# Patient Record
Sex: Female | Born: 1985 | Race: White | Hispanic: No | Marital: Married | Smoking: Former smoker
Health system: Southern US, Community
[De-identification: ages and names within clinical notes are randomized; demographics above are authoritative.]

## PROBLEM LIST (undated history)

## (undated) DIAGNOSIS — J45909 Unspecified asthma, uncomplicated: Secondary | ICD-10-CM

## (undated) DIAGNOSIS — K589 Irritable bowel syndrome without diarrhea: Secondary | ICD-10-CM

## (undated) DIAGNOSIS — D8940 Mast cell activation, unspecified: Secondary | ICD-10-CM

## (undated) DIAGNOSIS — E063 Autoimmune thyroiditis: Secondary | ICD-10-CM

## (undated) HISTORY — PX: OTHER SURGICAL HISTORY: SHX169

## (undated) HISTORY — PX: ADENOIDECTOMY: SUR15

## (undated) HISTORY — PX: TONSILLECTOMY: SUR1361

## (undated) HISTORY — PX: WISDOM TOOTH EXTRACTION: SHX21

## (undated) HISTORY — PX: ABDOMINAL HYSTERECTOMY: SHX81

---

## 2019-06-11 ENCOUNTER — Other Ambulatory Visit: Payer: Self-pay | Admitting: Obstetrics and Gynecology

## 2019-06-11 DIAGNOSIS — E041 Nontoxic single thyroid nodule: Secondary | ICD-10-CM

## 2019-06-17 ENCOUNTER — Encounter (INDEPENDENT_AMBULATORY_CARE_PROVIDER_SITE_OTHER): Payer: Self-pay

## 2019-06-17 ENCOUNTER — Ambulatory Visit
Admission: RE | Admit: 2019-06-17 | Discharge: 2019-06-17 | Disposition: A | Discharge status: Home / Self Care | Payer: Self-pay | Source: Ambulatory Visit | Attending: Obstetrics and Gynecology | Admitting: Obstetrics and Gynecology

## 2019-06-17 ENCOUNTER — Other Ambulatory Visit: Payer: Self-pay

## 2019-06-17 DIAGNOSIS — E041 Nontoxic single thyroid nodule: Secondary | ICD-10-CM | POA: Insufficient documentation

## 2019-12-11 DIAGNOSIS — N9489 Other specified conditions associated with female genital organs and menstrual cycle: Secondary | ICD-10-CM | POA: Insufficient documentation

## 2019-12-11 DIAGNOSIS — E669 Obesity, unspecified: Secondary | ICD-10-CM | POA: Insufficient documentation

## 2019-12-11 DIAGNOSIS — K580 Irritable bowel syndrome with diarrhea: Secondary | ICD-10-CM | POA: Insufficient documentation

## 2019-12-11 DIAGNOSIS — N946 Dysmenorrhea, unspecified: Secondary | ICD-10-CM | POA: Insufficient documentation

## 2020-04-17 ENCOUNTER — Encounter: Payer: Self-pay | Admitting: Emergency Medicine

## 2020-04-17 ENCOUNTER — Ambulatory Visit
Admission: EM | Admit: 2020-04-17 | Discharge: 2020-04-17 | Disposition: A | Discharge status: Home / Self Care | Payer: Managed Care, Other (non HMO)

## 2020-04-17 ENCOUNTER — Other Ambulatory Visit: Payer: Self-pay

## 2020-04-17 DIAGNOSIS — L0390 Cellulitis, unspecified: Secondary | ICD-10-CM | POA: Diagnosis not present

## 2020-04-17 DIAGNOSIS — W57XXXA Bitten or stung by nonvenomous insect and other nonvenomous arthropods, initial encounter: Secondary | ICD-10-CM | POA: Diagnosis not present

## 2020-04-17 DIAGNOSIS — S40262A Insect bite (nonvenomous) of left shoulder, initial encounter: Secondary | ICD-10-CM | POA: Diagnosis not present

## 2020-04-17 HISTORY — DX: Mast cell activation, unspecified: D89.40

## 2020-04-17 HISTORY — DX: Unspecified asthma, uncomplicated: J45.909

## 2020-04-17 HISTORY — DX: Autoimmune thyroiditis: E06.3

## 2020-04-17 MED ORDER — DOXYCYCLINE HYCLATE 100 MG PO CAPS: 100.0000 mg | ORAL_CAPSULE | Freq: Two times a day (BID) | ORAL | 0 refills | Status: DC

## 2020-04-17 NOTE — ED Provider Notes (Signed)
[facility]    CSN: [id number] Arrival date & time: 04/17/20  1248      History   Chief Complaint Chief Complaint  Patient presents with  . Insect Bite    HPI Jeanne Bates is a 34 y.o. female who presents with insect bite on her L posterior shoulder 3 days ago, and yesterday noticed her L axilla nodes are getting swollen. She states when she palpated the area, it felt raised like with a mosquito bite. She squeezed it to make sure if it was a tick, she could get all of it out. She has been applying antibiotic ointment on it. She denies fever. myalgias or chills.     Past Medical History:  Diagnosis Date  . Asthma   . Hashimoto thyroiditis, fibrous variant   . Mast cell activation syndrome (HCC)     There are no problems to display for this patient.   Past Surgical History:  Procedure Laterality Date  . ABDOMINAL HYSTERECTOMY    . ADENOIDECTOMY    . opharectomy    . TONSILLECTOMY    . WISDOM TOOTH EXTRACTION      OB History   No obstetric history on file.      Home Medications    Prior to Admission medications   Medication Sig Start Date End Date Taking? Authorizing Provider  b complex vitamins capsule Take 1 capsule by mouth daily.   Yes [provider]  cetirizine (ZYRTEC) 10 MG tablet Take by mouth.   Yes [provider]  Cholecalciferol 50 MCG (2000 UT) CAPS Take by mouth.   Yes [provider]  diphenhydrAMINE HCl (BENADRYL ALLERGY PO) Take by mouth.   Yes [provider]  EPINEPHrine 0.3 mg/0.3 mL IJ SOAJ injection Inject into the muscle. 07/08/19 07/07/20 Yes [provider]  famotidine (PEPCID) 20 MG tablet Take 20 mg by mouth 2 (two) times daily. 03/31/20  Yes [provider]  fluticasone (FLONASE) 50 MCG/ACT nasal spray 1 Spray by each nostril route Once a Day.   Yes [provider]  Probiotic Product (PROBIOTIC DAILY PO) Take by mouth.   Yes [provider]  VITAMIN E  PO Take by mouth.   Yes [provider]  zinc gluconate 50 MG tablet Take by mouth.   Yes [provider]  doxycycline (VIBRAMYCIN) 100 MG capsule Take 1 capsule (100 mg total) by mouth 2 (two) times daily. 04/17/20   Rodriguez-Southworth, Nettie Elm, PA-C    Family History Family History  Problem Relation Age of Onset  . Diabetes Mother   . Asthma Mother   . Hyperlipidemia Mother   . Hyperlipidemia Father   . Heart disease Father     Social History Social History   Tobacco Use  . Smoking status: Former Smoker    Quit date: 04/18/2007    Years since quitting: 13.0  . Smokeless tobacco: Never Used  Vaping Use  . Vaping Use: Never used  Substance Use Topics  . Alcohol use: Yes    Comment: occassional  . Drug use: Never     Allergies   Black walnut pollen allergy skin test, Cephalexin, Liothyronine, Nyquil hbp cold & flu [dm-doxylamine-acetaminophen], Prunus persica, Oxycodone-acetaminophen, Sulfamethoxazole-trimethoprim, Tramadol, Peanut oil, and Strawberry extract   Review of Systems Review of Systems  Constitutional: Negative for activity change, chills and fever.  Skin: Positive for rash and wound.  Hematological: Positive for adenopathy.     Physical Exam Triage Vital Signs ED Triage Vitals  Enc Vitals Group     BP 04/17/20 1335 (!) 118/58     Pulse Rate 04/17/20 1335 94     Resp 04/17/20 1335 18     Temp 04/17/20 1335 98.5 F (36.9 C)     Temp Source 04/17/20 1335 Oral     SpO2 04/17/20 1335 99 %     Weight 04/17/20 1335 210 lb (95.3 kg)     Height 04/17/20 1335 5\' 2"  (1.575 m)     Head Circumference --      Peak Flow --      Pain Score 04/17/20 1334 2     Pain Loc --      Pain Edu? --      Excl. in GC? --    No data found.  Updated Vital Signs BP (!) 118/58 (BP Location: Left Arm)   Pulse 94   Temp 98.5 F (36.9 C) (Oral)   Resp 18   Ht 5\' 2"  (1.575 m)   Wt 210 lb (95.3 kg)   SpO2 99%   BMI 38.41 kg/m   Visual Acuity Right  Eye Distance:   Left Eye Distance:   Bilateral Distance:    Right Eye Near:   Left Eye Near:    Bilateral Near:     Physical Exam Vitals and nursing note reviewed.  Constitutional:      General: She is not in acute distress.    Appearance: She is obese. She is not toxic-appearing.  HENT:     Head: Normocephalic.     Right Ear: External ear normal.     Left Ear: External ear normal.  Eyes:     General: No scleral icterus.    Conjunctiva/sclera: Conjunctivae normal.  Pulmonary:     Effort: Pulmonary effort is normal.  Musculoskeletal:        General: Normal range of motion.     Cervical back: Neck supple.  Lymphadenopathy:     Upper Body:     Right upper body: No axillary adenopathy.     Left upper body: Axillary adenopathy present.  Skin:    General: Skin is warm and dry.          Comments: Has a dollar size pink erythematous circular around the bite site which is about 2 mm x 2 mm wound that is having a scab forming.   Neurological:     Mental Status: She is alert and oriented to person, place, and time.     Gait: Gait normal.  Psychiatric:        Mood and Affect: Mood normal.        Behavior: Behavior normal.        Thought Content: Thought content normal.        Judgment: Judgment normal.      UC Treatments / Results  Labs (all labs ordered are listed, but only abnormal results are displayed) Labs Reviewed - No data to display  EKG   Radiology No results found.  Procedures Procedures (including critical care time)  Medications Ordered in UC Medications - No data to display  Initial Impression / Assessment and Plan / UC Course  I have reviewed the triage vital signs and the nursing notes. Has insect bite with cellulitis. I placed her on Doxy as noted. FU if she gets worse.  Final Clinical Impressions(s) / UC Diagnoses   Final diagnoses:  Insect bite of left shoulder, initial encounter  Cellulitis, unspecified cellulitis site   Discharge  Instructions  None    ED Prescriptions    Medication Sig Dispense Auth. Provider   doxycycline (VIBRAMYCIN) 100 MG capsule Take 1 capsule (100 mg total) by mouth 2 (two) times daily. 20 capsule Rodriguez-Southworth, Nettie Elm, PA-C     PDMP not reviewed this encounter.   Garey Ham, PA-C 04/17/20 1434

## 2020-04-17 NOTE — ED Triage Notes (Signed)
Patient in today c/o insect bite on her left shoulder/arm x 3 days. Patient states her lymph nodes are swollen under her left arm. Patient has been using OTC antibiotic cream.

## 2020-12-28 IMAGING — US US THYROID
1 series · 14 of 25 positions shown · non-contrast
Comparison: None.

CLINICAL DATA: Other. 33-year-old female with a reported left-sided
thyroid nodule.

EXAM:
THYROID ULTRASOUND
TECHNIQUE: Ultrasound examination of the thyroid gland and adjacent soft
tissues was performed.

[Series 1: us thyroid · 0.07mm/px · 14 of 57 slices shown]
[im 1/57]
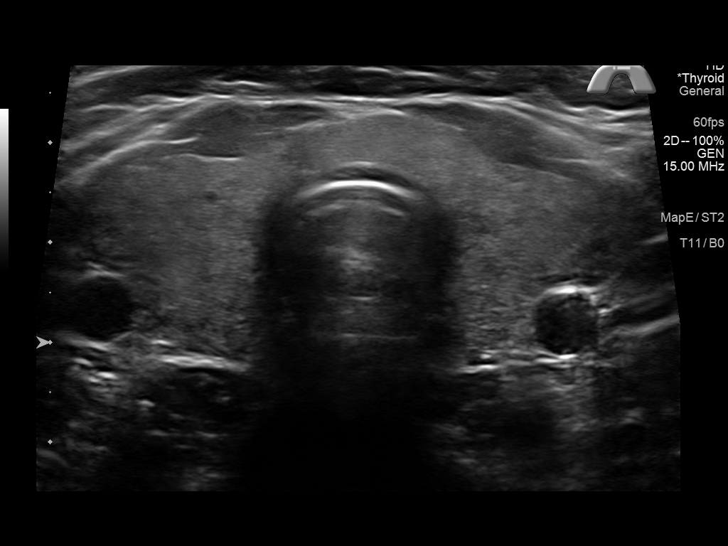
[im 5/57]
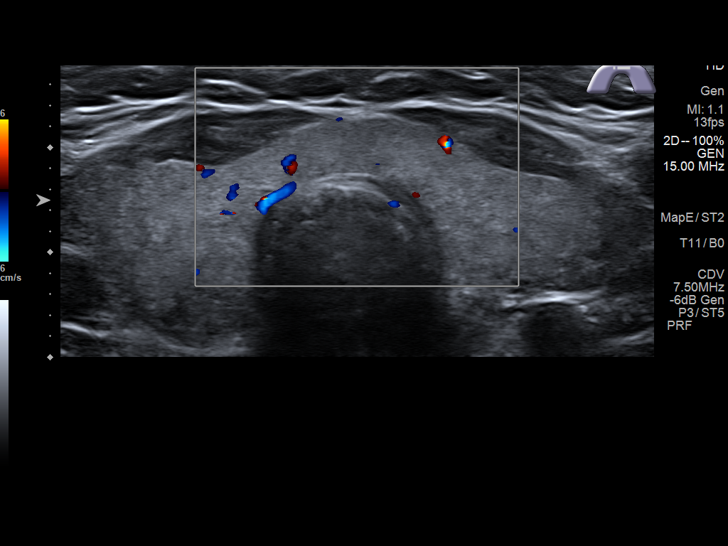
[im 10/57]
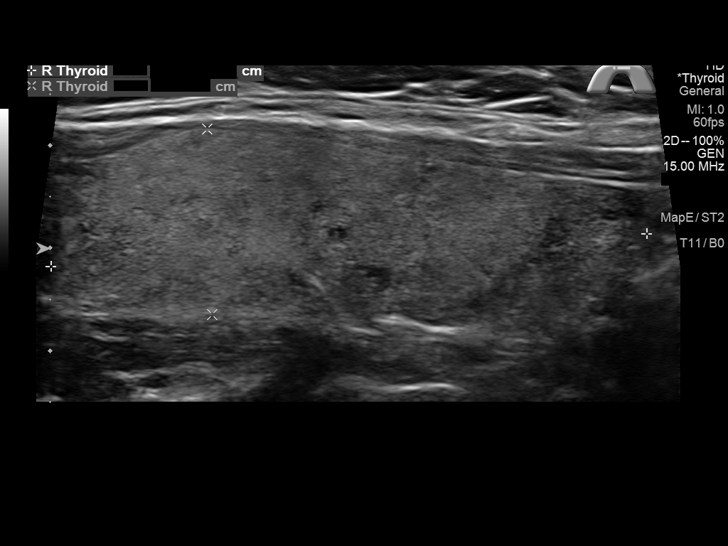
[im 15/57]
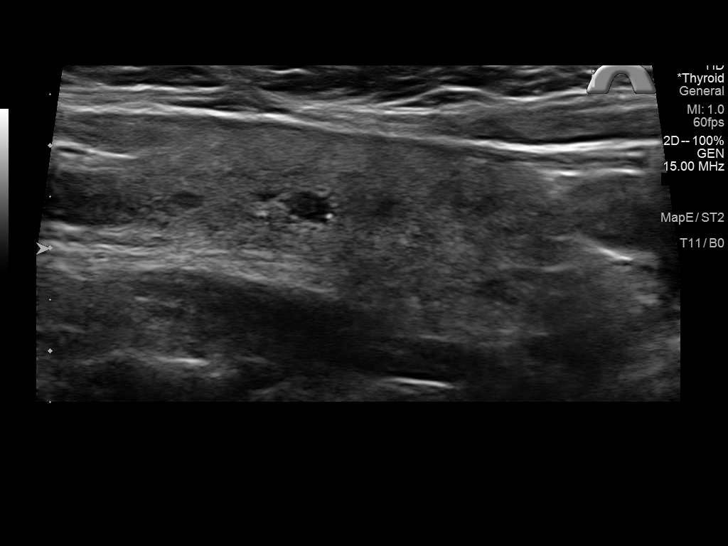
[im 19/57]
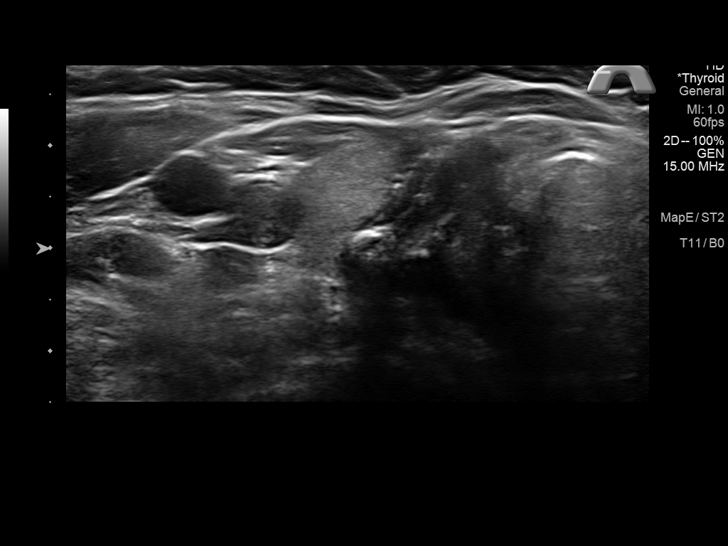
[im 22/57]
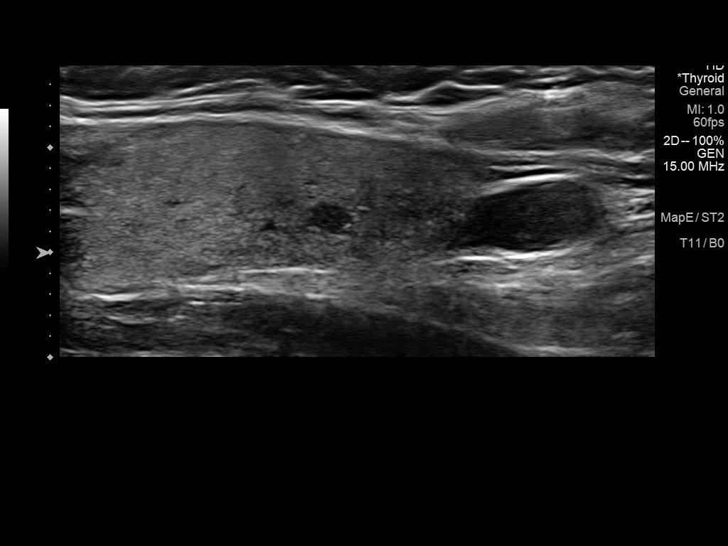
[im 26/57]
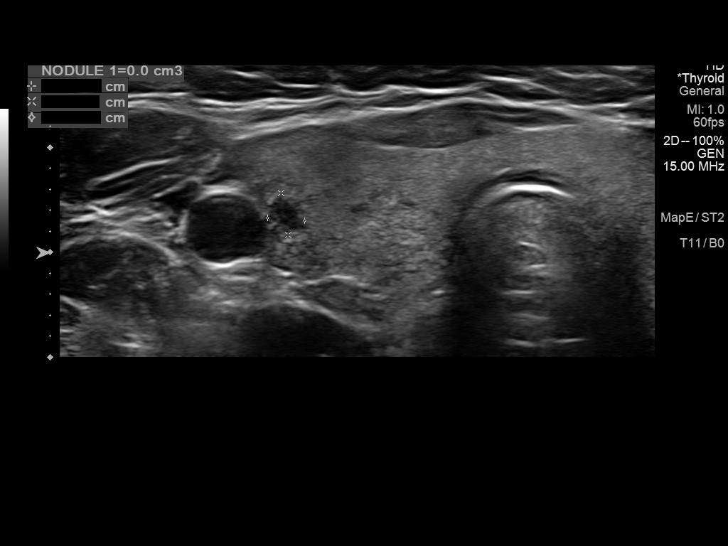
[im 31/57]
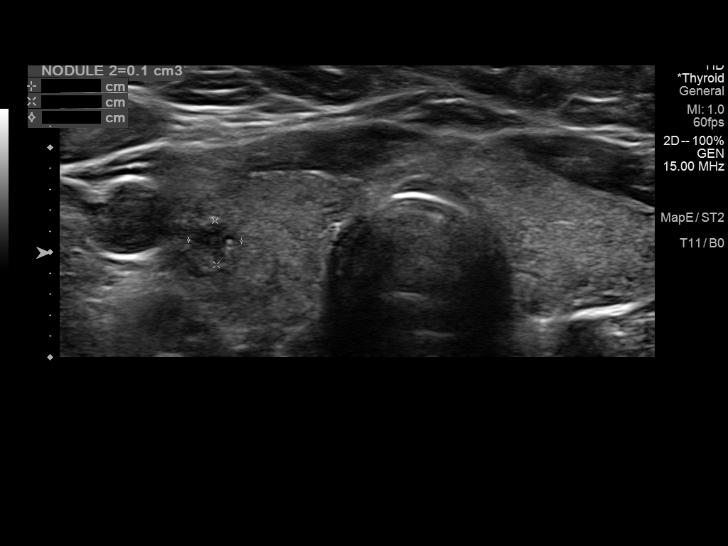
[im 36/57]
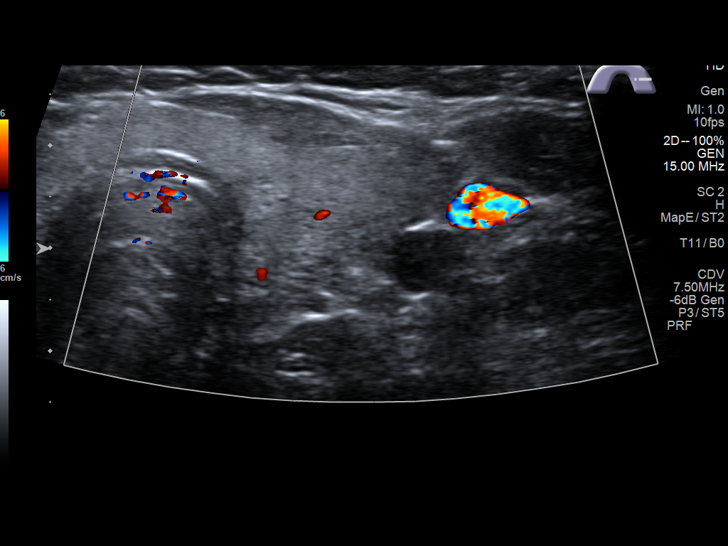
[im 38/57]
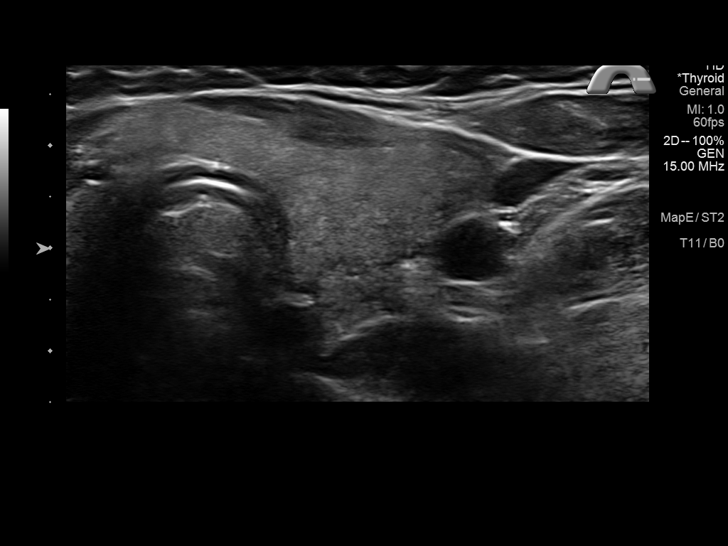
[im 43/57]
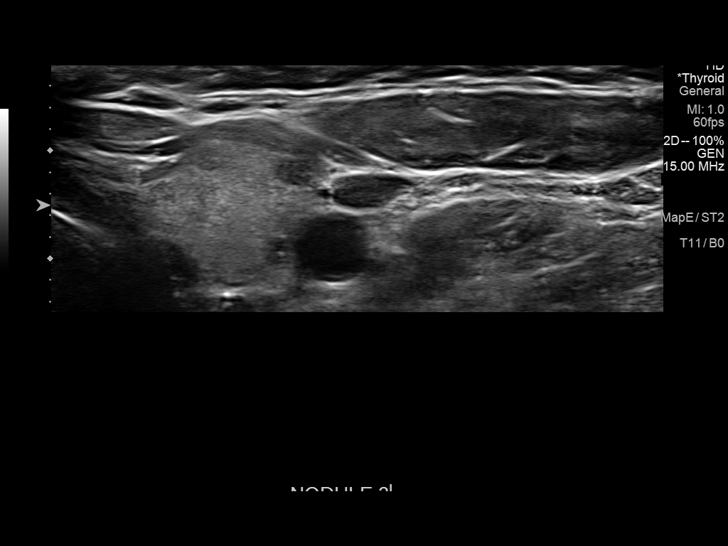
[im 47/57]
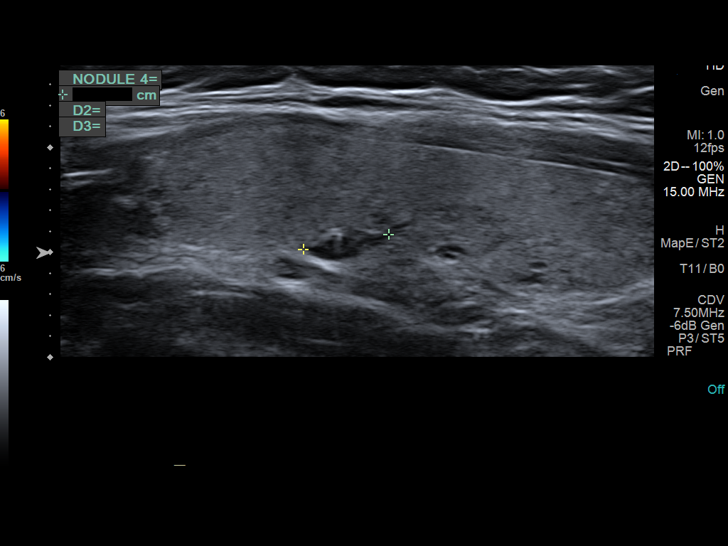
[im 52/57]
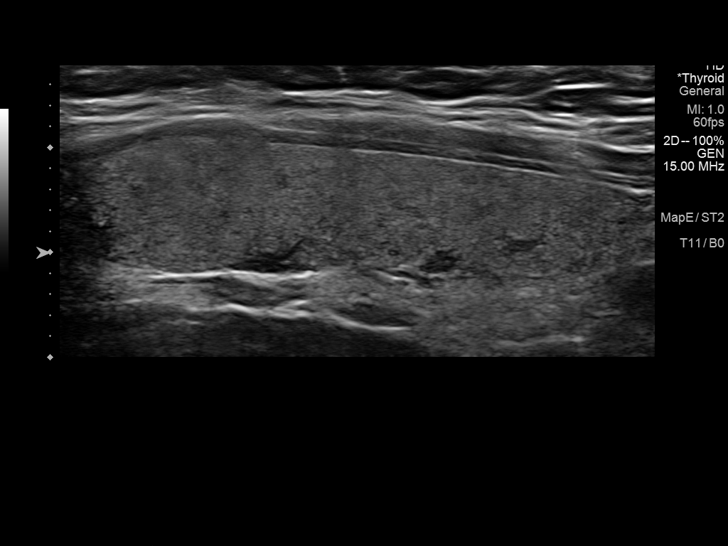
[im 57/57]
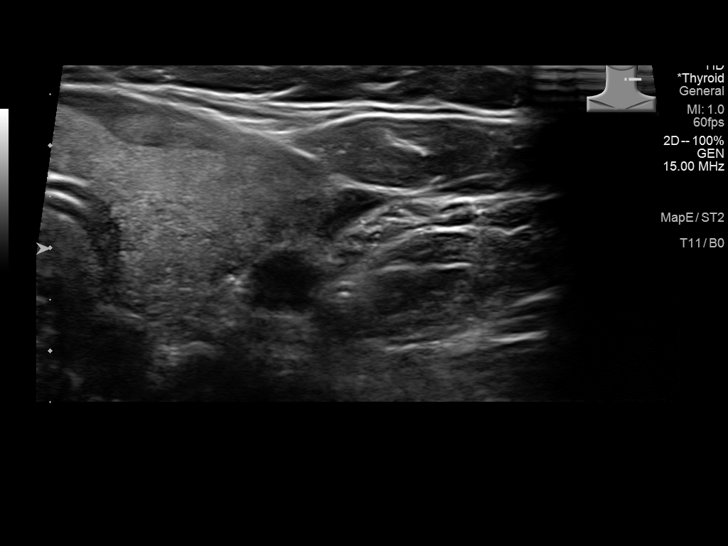

[14 of 25 positions shown; findings below may reference images not displayed]

FINDINGS: Parenchymal Echotexture: Moderately heterogenous

Isthmus: 0.6 cm

Right lobe: 5.8 x 1.8 x 2.0 cm

Left lobe: 5.4 x 1.6 x 2.0 cm

_________________________________________________________

Estimated total number of nodules >/= 1 cm: 0

Number of spongiform nodules >/=  2 cm not described below (TR1): 0

Number of mixed cystic and solid nodules >/= 1.5 cm not described
below (TR2): 0

_________________________________________________________

Mildly heterogeneous and enlarged thyroid gland containing multiple
small bilateral thyroid nodules. None of the nodules meet imaging
criteria to recommend biopsy or dedicated imaging surveillance.
IMPRESSION: Heterogeneous and diffusely large thyroid gland containing multiple
small thyroid nodules. None of the nodules meet imaging criteria to
recommend either biopsy or dedicated imaging surveillance.

The above is in keeping with the ACR TI-RADS recommendations - [HOSPITAL] 8098;[DATE].

## 2021-03-22 ENCOUNTER — Other Ambulatory Visit: Payer: Self-pay

## 2021-03-22 ENCOUNTER — Ambulatory Visit
Admission: RE | Admit: 2021-03-22 | Discharge: 2021-03-22 | Disposition: A | Discharge status: Home / Self Care | Payer: BC Managed Care – PPO | Source: Ambulatory Visit | Attending: Emergency Medicine | Admitting: Emergency Medicine

## 2021-03-22 VITALS — BP 141/101 | HR 124 | Temp 99.2°F | Resp 18

## 2021-03-22 DIAGNOSIS — U071 COVID-19: Secondary | ICD-10-CM | POA: Diagnosis not present

## 2021-03-22 DIAGNOSIS — J45909 Unspecified asthma, uncomplicated: Secondary | ICD-10-CM | POA: Diagnosis present

## 2021-03-22 DIAGNOSIS — Z760 Encounter for issue of repeat prescription: Secondary | ICD-10-CM | POA: Diagnosis present

## 2021-03-22 HISTORY — DX: Irritable bowel syndrome, unspecified: K58.9

## 2021-03-22 LAB — RESP PANEL BY RT-PCR (FLU A&B, COVID) ARPGX2
Influenza A by PCR: NEGATIVE
Influenza B by PCR: NEGATIVE
SARS Coronavirus 2 by RT PCR: POSITIVE — AB

## 2021-03-22 LAB — GROUP A STREP BY PCR: Group A Strep by PCR: NOT DETECTED

## 2021-03-22 MED ORDER — MOLNUPIRAVIR EUA 200MG CAPSULE: 4.0000 | ORAL_CAPSULE | Freq: Two times a day (BID) | ORAL | 0 refills | Status: AC

## 2021-03-22 MED ORDER — IBUPROFEN 600 MG PO TABS: 600.0000 mg | ORAL_TABLET | Freq: Four times a day (QID) | ORAL | 0 refills | Status: DC | PRN

## 2021-03-22 MED ORDER — FLUTICASONE PROPIONATE 50 MCG/ACT NA SUSP: 2.0000 | Freq: Every day | NASAL | 0 refills | Status: AC

## 2021-03-22 MED ORDER — ALUM & MAG HYDROXIDE-SIMETH 200-200-20 MG/5ML PO SUSP
30.0000 mL | Freq: Once | ORAL | Status: AC
  Administered 2021-03-22: 30 mL via ORAL

## 2021-03-22 MED ORDER — PROAIR RESPICLICK 108 (90 BASE) MCG/ACT IN AEPB: 2.0000 | INHALATION_SPRAY | Freq: Four times a day (QID) | RESPIRATORY_TRACT | 0 refills | Status: DC | PRN

## 2021-03-22 MED ORDER — AEROCHAMBER PLUS MISC: 2 refills | Status: AC

## 2021-03-22 MED ORDER — LIDOCAINE VISCOUS HCL 2 % MT SOLN
15.0000 mL | Freq: Once | OROMUCOSAL | Status: AC
  Administered 2021-03-22: 15 mL via ORAL

## 2021-03-22 NOTE — ED Notes (Signed)
Declines covid test at this time. She states a negative at home test yesterday.

## 2021-03-22 NOTE — Discharge Instructions (Addendum)
Start Flonase, Mucinex D.  Discontinue your antihistamines while taking the Mucinex D if you can.  You may take 600 mg of motrin with 1000 mg of tylenol up to 3-4 times a day as needed for pain. This is an effective combination for pain.  Use a NeilMed sinus rinse with distilled water as often as you want to to reduce nasal congestion. Follow the directions on the box.   Make sure you drink plenty of extra fluids.  Some people find salt water gargles and  Traditional Medicinal's "Throat Coat" tea helpful. Take 5 mL of liquid Benadryl and 5 mL of Maalox. Mix it together, and then hold it in your mouth for as long as you can and then swallow. You may do this 4 times a day.    Here is a list of primary care providers who are taking new patients:  Dr. Elizabeth Sauer [address] [place] [address] [phone]  [facility] [address] [place], [place] [address] [phone]  [facility] [place] [address] [place] [address]  [phone]  [facility] [address] [place], [place] [address] [phone]  [facility] [address] [place]  [phone] [place], [place] [address]  Here are clinics/ other resources who will see you if you do not have insurance. Some have certain criteria that you must meet. Call them and find out what they are:  [facility]: [address] [place], [place] [address] Phone: [phone] Hours: First and Third Saturdays of each Month, 9 a.m. - 1 p.m.  [facility]: [address] [place], [place] [address] Phone: [phone] Hours: Tuesday, 4 p.m. - 8 p.m. Thursday, 1 p.m. - 8 p.m. Wednesday, 9 a.m. - [facility] [address], [place], [place] [address] Phone: [phone] Pharmacy Phone Number: [phone] Dental Phone Number: [phone] [facility] Insurance Help: [phone]  Dental Hours: Monday - Thursday, 8 a.m. - 6 p.m.  Phineas Real  [facility] [address] [place], [place] [address] Phone: [phone] Pharmacy Phone Number: [phone] [facility] Insurance Help: [phone]  [facility] [address] [place], [place] [address] Phone: [phone] Pharmacy Phone Number: [phone] [facility] Insurance Help: [phone]  [facility] [address] [place], [place] [address] Phone: [phone] [facility] Insurance Help: [phone]   [facility] [address], [place], [place] [address] Phone: [phone]  Go to www.goodrx.com  or www.costplusdrugs.com to look up your medications. This will give you a list of where you can find your prescriptions at the most affordable prices. Or ask the pharmacist what the cash price is, or if they have any other discount programs available to help make your medication more affordable. This can be less expensive than what you would pay with insurance.

## 2021-03-22 NOTE — ED Provider Notes (Signed)
HPI  SUBJECTIVE:  Jeanne Bates is a 35 y.o. female who presents with 3 days of sore throat, bilateral ear pain worse on the left, fever T-max 102.1, body aches, headaches, nasal congestion, green/clear rhinorrhea, loss of sense of smell and taste, sinus pain and pressure, postnasal drip, cough.  States that she feels lightheaded.  States that she has not been able to eat due to sore throat.  She reports occasional laryngitis.  1 episode of diarrhea today, but is attributing this to her IBS no change in hearing, otorrhea, upper dental pain, facial swelling, wheezing, shortness of breath, dyspnea on exertion.  She states her chest feels "tight".  She denies chest pressure, heaviness, pleuritic chest pain.  No nausea, vomiting, abdominal pain, rash, drooling, trismus, neck stiffness, sensation of throat swelling shut, difficulty breathing.  She reports laryngitis/raspy voice.  No muffled voice.  She has been taking ibuprofen and Mucinex.  Last dose of ibuprofen was within 6 hours of evaluation.  No alleviating factors, symptoms are worse when she is febrile.  No calf pain, swelling, hemoptysis, recent immobilization, trauma, surgery in the past 4 weeks.  No COVID or flu exposure.  She did not get COVID or flu vaccines.  No tick bite in the past month.  She has a past medical history of COVID x2 , IBS, tonsillectomy, Hashimoto's thyroiditis, mast cell activation syndrome, asthma.  She states she needs a refill on her albuterol.  She does not have a spacer.  She does not take any other medications for her asthma.  LMP: Status post hysterectomy.  PMD: None.    Past Medical History:  Diagnosis Date   Asthma    Hashimoto thyroiditis, fibrous variant    IBS (irritable bowel syndrome)    Mast cell activation syndrome (HCC)     Past Surgical History:  Procedure Laterality Date   ABDOMINAL HYSTERECTOMY     ADENOIDECTOMY     opharectomy     TONSILLECTOMY     WISDOM TOOTH EXTRACTION      Family History   Problem Relation Age of Onset   Diabetes Mother    Asthma Mother    Hyperlipidemia Mother    Hyperlipidemia Father    Heart disease Father     Social History   Tobacco Use   Smoking status: Former    Types: Cigarettes    Quit date: 04/18/2007    Years since quitting: 13.9   Smokeless tobacco: Never  Vaping Use   Vaping Use: Never used  Substance Use Topics   Alcohol use: Yes    Comment: occassional   Drug use: Never    No current facility-administered medications for this encounter.  Current Outpatient Medications:    Albuterol Sulfate (PROAIR RESPICLICK) 108 (90 Base) MCG/ACT AEPB, Inhale 2 puffs into the lungs every 6 (six) hours as needed., Disp: 1 each, Rfl: 0   fluticasone (FLONASE) 50 MCG/ACT nasal spray, Place 2 sprays into both nostrils daily., Disp: 16 g, Rfl: 0   ibuprofen (ADVIL) 600 MG tablet, Take 1 tablet (600 mg total) by mouth every 6 (six) hours as needed., Disp: 30 tablet, Rfl: 0   molnupiravir EUA 200 mg CAPS, Take 4 capsules (800 mg total) by mouth 2 (two) times daily for 5 days., Disp: 40 capsule, Rfl: 0   Spacer/Aero-Holding Chambers (AEROCHAMBER PLUS) inhaler, Use with inhaler, Disp: 1 each, Rfl: 2   b complex vitamins capsule, Take 1 capsule by mouth daily., Disp: , Rfl:    Calcium Carbonate  Antacid (TUMS PO), Take by mouth., Disp: , Rfl:    cetirizine (ZYRTEC) 10 MG tablet, Take by mouth., Disp: , Rfl:    Cholecalciferol 50 MCG (2000 UT) CAPS, Take by mouth., Disp: , Rfl:    diphenhydrAMINE HCl (BENADRYL ALLERGY PO), Take by mouth., Disp: , Rfl:    famotidine (PEPCID) 20 MG tablet, Take 20 mg by mouth 2 (two) times daily., Disp: , Rfl:    Probiotic Product (PROBIOTIC DAILY PO), Take by mouth., Disp: , Rfl:    VITAMIN E PO, Take by mouth., Disp: , Rfl:    zinc gluconate 50 MG tablet, Take by mouth., Disp: , Rfl:   Allergies  Allergen Reactions   Black Walnut Pollen Allergy Skin Test Anaphylaxis   Cephalexin Hives   Citrus Anaphylaxis    Pt  reports throat swelling; histamine response per patient    Liothyronine Hives   Nyquil Hbp Cold & Flu [Dm-Doxylamine-Acetaminophen] Hives    Pt reports this is from the alcohol in nyquil   Prunus Persica Anaphylaxis   Strawberry (Diagnostic) Anaphylaxis     Throat swelling and "histamine response" per patient    Oxycodone-Acetaminophen Itching   Sulfamethoxazole-Trimethoprim    Tramadol Hives   Peanut Oil Nausea And Vomiting   Strawberry Extract Nausea And Vomiting     ROS  As noted in HPI.   Physical Exam  BP (!) 141/101 (BP Location: Left Arm)   Pulse (S) (!) 124   Temp 99.2 F (37.3 C) (Oral)   Resp 18   SpO2 98%   Constitutional: Well developed, well nourished, no acute distress.  Appears ill. Eyes:  EOMI, conjunctiva normal bilaterally HENT: Normocephalic, atraumatic,mucus membranes moist.  Bilateral external ears, EACs, TMs normal.  No pain with traction on pinna, palpation of tragus, palpation of mastoid bilaterally.  Extensive nasal congestion with erythematous, swollen turbinates.  Positive maxillary, frontal sinus tenderness.  Tonsils surgically absent.  Oropharynx erythematous.  Positive postnasal drip. Neck: Positive anterior cervical lymphadenopathy Respiratory: Normal inspiratory effort, lungs clear bilaterally. Cardiovascular: Regular tachycardia, no murmurs, rubs, gallops.  No chest wall tenderness. GI: nondistended skin: No rash, skin intact Musculoskeletal: Calves symmetric, nontender, no edema Neurologic: Alert & oriented x 3, no focal neuro deficits Psychiatric: Speech and behavior appropriate   ED Course   Medications  alum & mag hydroxide-simeth (MAALOX/MYLANTA) 200-200-20 MG/5ML suspension 30 mL (30 mLs Oral Given 03/22/21 1659)    And  lidocaine (XYLOCAINE) 2 % viscous mouth solution 15 mL (15 mLs Oral Given 03/22/21 1658)    Orders Placed This Encounter  Procedures   Group A Strep by PCR    Standing Status:   Standing    Number of  Occurrences:   1   Resp Panel by RT-PCR (Flu A&B, Covid) Nasopharyngeal Swab    Standing Status:   Standing    Number of Occurrences:   1    Order Specific Question:   Is this test for diagnosis or screening    Answer:   Diagnosis of ill patient    Order Specific Question:   Symptomatic for COVID-19 as defined by CDC    Answer:   Yes    Order Specific Question:   Date of Symptom Onset    Answer:   03/20/2021    Order Specific Question:   Hospitalized for COVID-19    Answer:   No    Order Specific Question:   Admitted to ICU for COVID-19    Answer:   No  Order Specific Question:   Previously tested for COVID-19    Answer:   No    Order Specific Question:   Resident in a congregate (group) care setting    Answer:   No    Order Specific Question:   Employed in healthcare setting    Answer:   No    Order Specific Question:   Pregnant    Answer:   No    Order Specific Question:   Has patient completed COVID vaccination(s) (2 doses of Pfizer/Moderna 1 dose of [employer])    Answer:   Unknown   Airborne and Contact precautions    Standing Status:   Standing    Number of Occurrences:   1   ED EKG    Standing Status:   Standing    Number of Occurrences:   1    Order Specific Question:   Reason for Exam    Answer:   Other (See Comments)    Results for orders placed or performed during the hospital encounter of 03/22/21 (from the past 24 hour(s))  Group A Strep by PCR     Status: None   Collection Time: 03/22/21  4:33 PM   Specimen: Throat; Sterile Swab  Result Value Ref Range   Group A Strep by PCR NOT DETECTED NOT DETECTED  Resp Panel by RT-PCR (Flu A&B, Covid) Nasopharyngeal Swab     Status: Abnormal   Collection Time: 03/22/21  4:34 PM   Specimen: Nasopharyngeal Swab; Nasopharyngeal(NP) swabs in vial transport medium  Result Value Ref Range   SARS Coronavirus 2 by RT PCR POSITIVE (A) NEGATIVE   Influenza A by PCR NEGATIVE NEGATIVE   Influenza B by PCR NEGATIVE NEGATIVE    No results found.  ED Clinical Impression  1. COVID-19 virus infection   2. Asthma, unspecified asthma severity, unspecified whether complicated, unspecified whether persistent   3. Medication refill      ED Assessment/Plan  EKG obtained due to tachycardia.  EKG: Sinus tachycardia, rate 122, normal axis, normal intervals.  No hypertrophy.  No ST-T wave changes.  Patient does not meet PERC criteria due to heart rate, however, Wells score for PE 1.5 points.  Her EKG shows tachycardia but has no other changes.  Feel that she is low risk for a PE.  Could be because she is ill versus thyroid disease flare versus  dehydration due to decreased p.o. intake.  Sore throat improved after GI cocktail.  Patient with an acute illness with systemic symptoms.  COVID-positive.  Suspect tachycardia is from this.  Home with Flonase, saline nasal irrigation, Benadryl/Maalox mixture, Mucinex D, Molnupiravir.   Labs are not available at this time.  Patient to monitor oxygen saturation.  She will go to the ED if it gets below 90% consistently.  We will also refill her albuterol inhaler and provide her a spacer as well.  Discussed labs, MDM, treatment plan, and plan for follow-up with patient. Discussed sn/sx that should prompt return to the ED. patient agrees with plan.   Meds ordered this encounter  Medications   AND Linked Order Group    alum & mag hydroxide-simeth (MAALOX/MYLANTA) 200-200-20 MG/5ML suspension 30 mL    lidocaine (XYLOCAINE) 2 % viscous mouth solution 15 mL   fluticasone (FLONASE) 50 MCG/ACT nasal spray    Sig: Place 2 sprays into both nostrils daily.    Dispense:  16 g    Refill:  0   Spacer/Aero-Holding Chambers (AEROCHAMBER PLUS) inhaler  Sig: Use with inhaler    Dispense:  1 each    Refill:  2    Please educate patient on use   ibuprofen (ADVIL) 600 MG tablet    Sig: Take 1 tablet (600 mg total) by mouth every 6 (six) hours as needed.    Dispense:  30 tablet    Refill:  0    Albuterol Sulfate (PROAIR RESPICLICK) 108 (90 Base) MCG/ACT AEPB    Sig: Inhale 2 puffs into the lungs every 6 (six) hours as needed.    Dispense:  1 each    Refill:  0   molnupiravir EUA 200 mg CAPS    Sig: Take 4 capsules (800 mg total) by mouth 2 (two) times daily for 5 days.    Dispense:  40 capsule    Refill:  0      *This clinic note was created using Scientist, clinical (histocompatibility and immunogenetics). Therefore, there may be occasional mistakes despite careful proofreading.  ?    Domenick Gong, MD 03/22/21 1734

## 2021-03-22 NOTE — ED Triage Notes (Signed)
Patient presents to Urgent Care with complaints of sore throat, SOB, cough, fever, and left ear pain since Saturday. Treating symptoms with advil and mucinex. Pt states she needs an albuterol medication refill.   Denies n/v or diarrhea.

## 2021-04-19 ENCOUNTER — Other Ambulatory Visit: Payer: Self-pay

## 2021-04-19 ENCOUNTER — Ambulatory Visit
Admission: RE | Admit: 2021-04-19 | Discharge: 2021-04-19 | Disposition: A | Discharge status: Home / Self Care | Payer: BC Managed Care – PPO | Source: Ambulatory Visit | Attending: Family Medicine | Admitting: Family Medicine

## 2021-04-19 VITALS — BP 108/79 | HR 75 | Temp 98.5°F | Resp 18 | Ht 62.0 in | Wt 210.1 lb

## 2021-04-19 DIAGNOSIS — H9202 Otalgia, left ear: Secondary | ICD-10-CM

## 2021-04-19 MED ORDER — KETOROLAC TROMETHAMINE 10 MG PO TABS: 10.0000 mg | ORAL_TABLET | Freq: Four times a day (QID) | ORAL | 0 refills | Status: DC | PRN

## 2021-04-19 NOTE — ED Triage Notes (Signed)
Pt c/o left ear pain. Started about a month ago, when she had covid. She states the lymph nodes are swollen under her ear as well. Denies fever.

## 2021-04-19 NOTE — ED Provider Notes (Signed)
[facility]    CSN: [id number] Arrival date & time: 04/19/21  1049      History   Chief Complaint Chief Complaint  Patient presents with   Otalgia    HPI 35 year old female presents with ear pain.  Patient reports that she has had left ear pain for the past month.  She states that it started after she had COVID-19.  She states that it has failed to improve.  She states that it initially responded to Advil but now does not.  She continues to have pain.  She also reports swollen lymph nodes under her ear.  Denies fever.  She rates her pain as 4/10 in severity.  Denies any other respiratory symptoms.  No other complaints or concerns at this time.  Past Medical History:  Diagnosis Date   Asthma    Hashimoto thyroiditis, fibrous variant    IBS (irritable bowel syndrome)    Mast cell activation syndrome (HCC)     There are no problems to display for this patient.   Past Surgical History:  Procedure Laterality Date   ABDOMINAL HYSTERECTOMY     ADENOIDECTOMY     opharectomy     TONSILLECTOMY     WISDOM TOOTH EXTRACTION      OB History   No obstetric history on file.      Home Medications    Prior to Admission medications   Medication Sig Start Date End Date Taking? Authorizing Provider  Albuterol Sulfate (PROAIR RESPICLICK) 108 (90 Base) MCG/ACT AEPB Inhale 2 puffs into the lungs every 6 (six) hours as needed. 03/22/21  Yes Domenick Gong, MD  b complex vitamins capsule Take 1 capsule by mouth daily.   Yes [provider]  Cholecalciferol 50 MCG (2000 UT) CAPS Take by mouth.   Yes [provider]  diphenhydrAMINE HCl (BENADRYL ALLERGY PO) Take by mouth.   Yes [provider]  fluticasone (FLONASE) 50 MCG/ACT nasal spray Place 2 sprays into both nostrils daily. 03/22/21  Yes Domenick Gong, MD  ketorolac (TORADOL) 10 MG tablet Take 1 tablet (10 mg total) by mouth every 6 (six) hours as needed for moderate pain or severe pain.  04/19/21  Yes Owain Eckerman G, DO  loratadine (CLARITIN) 10 MG tablet Take 10 mg by mouth daily.   Yes [provider]  zinc gluconate 50 MG tablet Take by mouth.   Yes [provider]  Calcium Carbonate Antacid (TUMS PO) Take by mouth.    [provider]  famotidine (PEPCID) 20 MG tablet Take 20 mg by mouth 2 (two) times daily. 03/31/20   [provider]  Spacer/Aero-Holding Deretha Emory (AEROCHAMBER PLUS) inhaler Use with inhaler 03/22/21   Domenick Gong, MD    Family History Family History  Problem Relation Age of Onset   Diabetes Mother    Asthma Mother    Hyperlipidemia Mother    Hyperlipidemia Father    Heart disease Father     Social History Social History   Tobacco Use   Smoking status: Former    Types: Cigarettes    Quit date: 04/18/2007    Years since quitting: 14.0   Smokeless tobacco: Never  Vaping Use   Vaping Use: Never used  Substance Use Topics   Alcohol use: Yes    Comment: occassional   Drug use: Never     Allergies   Black walnut pollen allergy skin test, Cephalexin, Citrus, Liothyronine, Nyquil hbp cold & flu [dm-doxylamine-acetaminophen], Prunus persica, Strawberry (diagnostic), Oxycodone-acetaminophen, Sulfamethoxazole-trimethoprim,  Tramadol, Peanut oil, and Strawberry extract   Review of Systems Review of Systems  HENT:  Positive for ear pain.   Hematological:  Positive for adenopathy.   Physical Exam Triage Vital Signs ED Triage Vitals  Enc Vitals Group     BP 04/19/21 1135 108/79     Pulse Rate 04/19/21 1135 75     Resp 04/19/21 1135 18     Temp 04/19/21 1135 98.5 F (36.9 C)     Temp Source 04/19/21 1135 Oral     SpO2 04/19/21 1135 99 %     Weight 04/19/21 1133 210 lb 1.6 oz (95.3 kg)     Height 04/19/21 1133 5\' 2"  (1.575 m)     Head Circumference --      Peak Flow --      Pain Score 04/19/21 1133 4     Pain Loc --      Pain Edu? --      Excl. in GC? --    Updated Vital Signs BP 108/79 (BP  Location: Left Arm)   Pulse 75   Temp 98.5 F (36.9 C) (Oral)   Resp 18   Ht 5\' 2"  (1.575 m)   Wt 95.3 kg   SpO2 99%   BMI 38.43 kg/m   Visual Acuity Right Eye Distance:   Left Eye Distance:   Bilateral Distance:    Right Eye Near:   Left Eye Near:    Bilateral Near:     Physical Exam Constitutional:      General: She is not in acute distress.    Appearance: Normal appearance.  HENT:     Head: Normocephalic and atraumatic.     Right Ear: Tympanic membrane and ear canal normal.     Left Ear: Tympanic membrane and ear canal normal.     Nose: Nose normal.  Eyes:     General:        Right eye: No discharge.        Left eye: No discharge.     Conjunctiva/sclera: Conjunctivae normal.  Cardiovascular:     Rate and Rhythm: Normal rate and regular rhythm.  Pulmonary:     Effort: Pulmonary effort is normal.     Breath sounds: Normal breath sounds. No wheezing, rhonchi or rales.  Musculoskeletal:     Cervical back: Neck supple.  Lymphadenopathy:     Cervical: No cervical adenopathy.  Neurological:     Mental Status: She is alert.     UC Treatments / Results  Labs (all labs ordered are listed, but only abnormal results are displayed) Labs Reviewed - No data to display  EKG   Radiology No results found.  Procedures Procedures (including critical care time)  Medications Ordered in UC Medications - No data to display  Initial Impression / Assessment and Plan / UC Course  I have reviewed the triage vital signs and the nursing notes.  Pertinent labs & imaging results that were available during my care of the patient were reviewed by me and considered in my medical decision making (see chart for details).    35 year old female presents with chronic ear pain.  Her exam is normal.  There is no appreciable lymphadenopathy.  No abnormalities of the ear canal or TM.  Toradol as needed for pain.  Recommended seeing ear nose and throat.  I called [facility] and secured  an appointment for 9/29.  Final Clinical Impressions(s) / UC Diagnoses   Final diagnoses:  Left ear pain  Discharge Instructions      Appt with [facility] in [place] [phone] 9/29).  Medication as prescribed. No advil while using the medication.  Take care  Dr. Adriana Simas    ED Prescriptions     Medication Sig Dispense Auth. Provider   ketorolac (TORADOL) 10 MG tablet Take 1 tablet (10 mg total) by mouth every 6 (six) hours as needed for moderate pain or severe pain. 20 tablet Tommie Sams, DO      PDMP not reviewed this encounter.   Tommie Sams, DO 04/19/21 1316

## 2021-04-19 NOTE — Discharge Instructions (Addendum)
Appt with [facility] in [place] [phone] 9/29).  Medication as prescribed. No advil while using the medication.  Take care  Dr. Adriana Simas

## 2021-06-23 ENCOUNTER — Encounter: Payer: Self-pay | Admitting: Emergency Medicine

## 2021-06-23 ENCOUNTER — Other Ambulatory Visit: Payer: Self-pay

## 2021-06-23 ENCOUNTER — Ambulatory Visit
Admission: EM | Admit: 2021-06-23 | Discharge: 2021-06-23 | Disposition: A | Discharge status: Home / Self Care | Payer: BC Managed Care – PPO | Attending: Emergency Medicine | Admitting: Emergency Medicine

## 2021-06-23 DIAGNOSIS — J069 Acute upper respiratory infection, unspecified: Secondary | ICD-10-CM | POA: Diagnosis not present

## 2021-06-23 MED ORDER — PROMETHAZINE-DM 6.25-15 MG/5ML PO SYRP: 5.0000 mL | ORAL_SOLUTION | Freq: Four times a day (QID) | ORAL | 0 refills | Status: DC | PRN

## 2021-06-23 MED ORDER — BENZONATATE 100 MG PO CAPS: 100.0000 mg | ORAL_CAPSULE | Freq: Three times a day (TID) | ORAL | 0 refills | Status: DC

## 2021-06-23 MED ORDER — PREDNISONE 20 MG PO TABS: 40.0000 mg | ORAL_TABLET | Freq: Every day | ORAL | 0 refills | Status: DC

## 2021-06-23 NOTE — Discharge Instructions (Signed)
Your symptoms are most likely related to a virus and should steadily improve with time, it may take up to 7 to 10 days before you truly begin to feel like yourself, I do not believe that you have the flu today as your symptoms are not consistent with what we have been seeing in the clinic and that you are still within the 71-month window for a false positive reading due to possible antibiotics from your last COVID infection  Starting today you may take prednisone every morning with food for the next 5 days, this will help reduce any inflammation and irritation that can be caused by your asthma  Continue use of your albuterol inhaler every 4-6 hours as needed  You may use Tessalon pill every 8 hours to help with coughing  You may use cough syrup every 6 hours as needed, be mindful this medication may have a sedative effect    You can take Tylenol and/or Ibuprofen as needed for fever reduction and pain relief.   For cough: honey 1/2 to 1 teaspoon (you can dilute the honey in water or another fluid).  You can also use guaifenesin and dextromethorphan for cough. You can use a humidifier for chest congestion and cough.  If you don't have a humidifier, you can sit in the bathroom with the hot shower running.      For sore throat: try warm salt water gargles, cepacol lozenges, throat spray, warm tea or water with lemon/honey, popsicles or ice, or OTC cold relief medicine for throat discomfort.   For congestion: take a daily anti-histamine like Zyrtec, Claritin, and a oral decongestant, such as pseudoephedrine.  You can also use Flonase 1-2 sprays in each nostril daily.   It is important to stay hydrated: drink plenty of fluids (water, gatorade/powerade/pedialyte, juices, or teas) to keep your throat moisturized and help further relieve irritation/discomfort.

## 2021-06-23 NOTE — ED Triage Notes (Addendum)
Pt c/o cough, runny nose and fever sxs started last night. Pt had Covid in August.

## 2021-06-23 NOTE — ED Provider Notes (Signed)
[facility]    CSN: [id number] Arrival date & time: 06/23/21  0802      History   Chief Complaint Chief Complaint  Patient presents with   Cough   Nasal Congestion   Fever    HPI Jeanne Bates is a 35 y.o. female.   Patient presents with fever, nasal congestion, rhinorrhea, bilateral ear pain and fullness, nonproductive cough, increased shortness of breath and wheezing and intermittent generalized headaches for 1 day.  Known sick contacts for COVID.  Tolerating food and liquids.  History of asthma, IBS.  Has attempted use of over-the-counter medication with minimal relief.  Denies chills, body aches, sore throat, abdominal pain, nausea, vomiting, diarrhea, chest pain or tightness.  Past Medical History:  Diagnosis Date   Asthma    Hashimoto thyroiditis, fibrous variant    IBS (irritable bowel syndrome)    Mast cell activation syndrome (HCC)     There are no problems to display for this patient.   Past Surgical History:  Procedure Laterality Date   ABDOMINAL HYSTERECTOMY     ADENOIDECTOMY     opharectomy     TONSILLECTOMY     WISDOM TOOTH EXTRACTION      OB History   No obstetric history on file.      Home Medications    Prior to Admission medications   Medication Sig Start Date End Date Taking? Authorizing Provider  Albuterol Sulfate (PROAIR RESPICLICK) 108 (90 Base) MCG/ACT AEPB Inhale 2 puffs into the lungs every 6 (six) hours as needed. 03/22/21  Yes Domenick Gong, MD  b complex vitamins capsule Take 1 capsule by mouth daily.   Yes [provider]  Cholecalciferol 50 MCG (2000 UT) CAPS Take by mouth.   Yes [provider]  diphenhydrAMINE HCl (BENADRYL ALLERGY PO) Take by mouth.   Yes [provider]  famotidine (PEPCID) 20 MG tablet Take 20 mg by mouth 2 (two) times daily. 03/31/20  Yes [provider]  fluticasone (FLONASE) 50 MCG/ACT nasal spray Place 2 sprays into both nostrils daily. 03/22/21  Yes  Domenick Gong, MD  loratadine (CLARITIN) 10 MG tablet Take 10 mg by mouth daily.   Yes [provider]  zinc gluconate 50 MG tablet Take by mouth.   Yes [provider]  Calcium Carbonate Antacid (TUMS PO) Take by mouth.    [provider]  ketorolac (TORADOL) 10 MG tablet Take 1 tablet (10 mg total) by mouth every 6 (six) hours as needed for moderate pain or severe pain. 04/19/21   Tommie Sams, DO  Spacer/Aero-Holding Chambers (AEROCHAMBER PLUS) inhaler Use with inhaler 03/22/21   Domenick Gong, MD    Family History Family History  Problem Relation Age of Onset   Diabetes Mother    Asthma Mother    Hyperlipidemia Mother    Hyperlipidemia Father    Heart disease Father     Social History Social History   Tobacco Use   Smoking status: Former    Types: Cigarettes    Quit date: 04/18/2007    Years since quitting: 14.1   Smokeless tobacco: Never  Vaping Use   Vaping Use: Never used  Substance Use Topics   Alcohol use: Yes    Comment: occassional   Drug use: Never     Allergies   Black walnut pollen allergy skin test, Cephalexin, Citrus, Liothyronine, Nyquil hbp cold & flu [dm-doxylamine-acetaminophen], Prunus persica, Strawberry (diagnostic), Oxycodone-acetaminophen, Sulfamethoxazole-trimethoprim, Tramadol, Peanut oil, and Strawberry extract   Review  of Systems Review of Systems  Constitutional:  Positive for fever. Negative for activity change, appetite change, chills, diaphoresis, fatigue and unexpected weight change.  HENT:  Positive for congestion, ear pain and rhinorrhea. Negative for dental problem, drooling, ear discharge, facial swelling, hearing loss, mouth sores, nosebleeds, postnasal drip, sinus pressure, sinus pain, sneezing, sore throat, tinnitus, trouble swallowing and voice change.   Respiratory:  Positive for cough, shortness of breath and wheezing. Negative for apnea, choking, chest tightness and stridor.   Cardiovascular:  Negative.   Gastrointestinal: Negative.   Skin: Negative.   Neurological:  Positive for headaches. Negative for dizziness, tremors, seizures, syncope, facial asymmetry, speech difficulty, weakness, light-headedness and numbness.    Physical Exam Triage Vital Signs ED Triage Vitals [06/23/21 0818]  Enc Vitals Group     BP 112/82     Pulse Rate (!) 115     Resp 20     Temp 99.4 F (37.4 C)     Temp Source Oral     SpO2 98 %     Weight      Height      Head Circumference      Peak Flow      Pain Score      Pain Loc      Pain Edu?      Excl. in GC?    No data found.  Updated Vital Signs BP 112/82 (BP Location: Right Arm)   Pulse (!) 115   Temp 99.4 F (37.4 C) (Oral)   Resp 20   SpO2 98%   Visual Acuity Right Eye Distance:   Left Eye Distance:   Bilateral Distance:    Right Eye Near:   Left Eye Near:    Bilateral Near:     Physical Exam Constitutional:      Appearance: Normal appearance. She is normal weight.  HENT:     Head: Normocephalic.     Right Ear: Tympanic membrane, ear canal and external ear normal.     Left Ear: Tympanic membrane, ear canal and external ear normal.     Nose: Congestion and rhinorrhea present.     Mouth/Throat:     Mouth: Mucous membranes are moist.     Pharynx: Posterior oropharyngeal erythema present.  Eyes:     Extraocular Movements: Extraocular movements intact.  Cardiovascular:     Rate and Rhythm: Normal rate and regular rhythm.     Pulses: Normal pulses.     Heart sounds: Normal heart sounds.  Pulmonary:     Effort: Pulmonary effort is normal.     Breath sounds: Normal breath sounds.  Musculoskeletal:     Cervical back: Normal range of motion.  Lymphadenopathy:     Cervical: Cervical adenopathy present.  Skin:    General: Skin is warm and dry.  Neurological:     Mental Status: She is alert. Mental status is at baseline.  Psychiatric:        Mood and Affect: Mood normal.        Behavior: Behavior normal.     UC  Treatments / Results  Labs (all labs ordered are listed, but only abnormal results are displayed) Labs Reviewed - No data to display  EKG   Radiology No results found.  Procedures Procedures (including critical care time)  Medications Ordered in UC Medications - No data to display  Initial Impression / Assessment and Plan / UC Course  I have reviewed the triage vital signs and the nursing notes.  Pertinent  labs & imaging results that were available during my care of the patient were reviewed by me and considered in my medical decision making (see chart for details).  Viral URI with cough  Will defer testing at this time as symptoms are not consistent with fluid and patient is still in 29-month window for false positive due to last  COVID infection and being in late August, discussed with patient, will treat conservatively, prescribed prednisone 40 mg daily for 5 days, Promethazine DM 6.25-15 mg / 5 mL every 6 hours and Tessalon 100 mg every 8 hours for comfort, may use over-the-counter medications in addition, may follow-up with urgent care as needed Final Clinical Impressions(s) / UC Diagnoses   Final diagnoses:  None   Discharge Instructions   None    ED Prescriptions   None    PDMP not reviewed this encounter.   Valinda Hoar, [place] 06/23/21 [phone]

## 2021-06-30 ENCOUNTER — Ambulatory Visit: Payer: BC Managed Care – PPO | Admitting: Internal Medicine

## 2021-06-30 ENCOUNTER — Encounter: Payer: Self-pay | Admitting: Internal Medicine

## 2021-06-30 ENCOUNTER — Other Ambulatory Visit: Payer: Self-pay

## 2021-06-30 VITALS — BP 130/86 | HR 86 | Ht 62.0 in | Wt 218.2 lb

## 2021-06-30 DIAGNOSIS — Z1331 Encounter for screening for depression: Secondary | ICD-10-CM | POA: Insufficient documentation

## 2021-06-30 DIAGNOSIS — K589 Irritable bowel syndrome without diarrhea: Secondary | ICD-10-CM | POA: Insufficient documentation

## 2021-06-30 DIAGNOSIS — E042 Nontoxic multinodular goiter: Secondary | ICD-10-CM | POA: Insufficient documentation

## 2021-06-30 DIAGNOSIS — J301 Allergic rhinitis due to pollen: Secondary | ICD-10-CM | POA: Insufficient documentation

## 2021-06-30 DIAGNOSIS — G2581 Restless legs syndrome: Secondary | ICD-10-CM | POA: Insufficient documentation

## 2021-06-30 DIAGNOSIS — F419 Anxiety disorder, unspecified: Secondary | ICD-10-CM | POA: Insufficient documentation

## 2021-06-30 DIAGNOSIS — Z90710 Acquired absence of both cervix and uterus: Secondary | ICD-10-CM | POA: Insufficient documentation

## 2021-06-30 DIAGNOSIS — R7303 Prediabetes: Secondary | ICD-10-CM | POA: Insufficient documentation

## 2021-06-30 DIAGNOSIS — J4520 Mild intermittent asthma, uncomplicated: Secondary | ICD-10-CM | POA: Insufficient documentation

## 2021-06-30 DIAGNOSIS — G479 Sleep disorder, unspecified: Secondary | ICD-10-CM

## 2021-06-30 NOTE — Progress Notes (Addendum)
Date:  06/30/2021   Name:  Jeanne Bates   DOB:  11-Nov-1985   MRN:  [id number]  New patient to establish care.  Recovering from suspected influenza.  Chief Complaint: Fatigue and Hypothyroidism  Insomnia Primary symptoms: sleep disturbance, difficulty falling asleep, frequent awakening.   The current episode started more than one year. The onset quality is undetermined. The problem occurs nightly. The problem is unchanged. The symptoms are aggravated by anxiety. PMH includes: depression (night terrors and snoring), restless leg syndrome.   Thyroid Problem Presents for follow-up (thyroid nodules) visit. Symptoms include anxiety, depressed mood, fatigue and weight gain. Patient reports no hoarse voice, leg swelling, palpitations or tremors. Symptom course: fatigue, poor sleep - hx of nodules with normal thryoid function tests.  Diabetes She presents for her follow-up diabetic visit. Diabetes type: prediabetes; also had gestational DM with her last child. Hypoglycemia symptoms include nervousness/anxiousness. Pertinent negatives for hypoglycemia include no dizziness, headaches or tremors. Associated symptoms include fatigue. Pertinent negatives for diabetes include no chest pain. Current diabetic treatment includes diet. She is compliant with treatment most of the time. Her weight is stable. She is following a generally healthy diet. There is no compliance with monitoring of blood glucose.    Review of Systems  Constitutional:  Positive for fatigue and weight gain. Negative for chills, fever and unexpected weight change.  HENT:  Negative for hoarse voice and trouble swallowing.        Snoring and occasional night terrors  Respiratory:  Negative for cough, chest tightness, shortness of breath and wheezing.   Cardiovascular:  Negative for chest pain and palpitations.  Gastrointestinal:  Negative for abdominal pain.  Musculoskeletal:  Positive for myalgias (restless legs in the evening - not  consistently).  Allergic/Immunologic: Positive for environmental allergies.  Neurological:  Negative for dizziness, tremors and headaches.  Psychiatric/Behavioral:  Positive for depression (night terrors and snoring), dysphoric mood and sleep disturbance. The patient is nervous/anxious and has insomnia.    There are no problems to display for this patient.   Allergies  Allergen Reactions   Black Walnut Pollen Allergy Skin Test Anaphylaxis   Cephalexin Hives   Citrus Anaphylaxis    Pt reports throat swelling; histamine response per patient    Liothyronine Hives   Nyquil Hbp Cold & Flu [Dm-Doxylamine-Acetaminophen] Hives    Pt reports this is from the alcohol in nyquil   Prunus Persica Anaphylaxis   Strawberry (Diagnostic) Anaphylaxis     Throat swelling and "histamine response" per patient    Oxycodone-Acetaminophen Itching   Sulfamethoxazole-Trimethoprim    Tramadol Hives   Peanut Oil Nausea And Vomiting   Strawberry Extract Nausea And Vomiting    Past Surgical History:  Procedure Laterality Date   ABDOMINAL HYSTERECTOMY     ADENOIDECTOMY     opharectomy     TONSILLECTOMY     WISDOM TOOTH EXTRACTION      Social History   Tobacco Use   Smoking status: Former    Types: Cigarettes    Quit date: 04/18/2007    Years since quitting: 14.2   Smokeless tobacco: Never  Vaping Use   Vaping Use: Never used  Substance Use Topics   Alcohol use: Yes    Comment: occassional   Drug use: Never     Medication list has been reviewed and updated.  Current Meds  Medication Sig   Albuterol Sulfate (PROAIR RESPICLICK) 108 (90 Base) MCG/ACT AEPB Inhale 2 puffs into the lungs every 6 (six) hours  as needed.   b complex vitamins capsule Take 1 capsule by mouth daily.   Calcium Carbonate Antacid (TUMS PO) Take by mouth.   Cholecalciferol 50 MCG (2000 UT) CAPS Take by mouth.   famotidine (PEPCID) 20 MG tablet Take 20 mg by mouth 2 (two) times daily.   fluticasone (FLONASE) 50 MCG/ACT  nasal spray Place 2 sprays into both nostrils daily.   loratadine (CLARITIN) 10 MG tablet Take 10 mg by mouth daily.   Spacer/Aero-Holding Chambers (AEROCHAMBER PLUS) inhaler Use with inhaler   zinc gluconate 50 MG tablet Take by mouth.    Results of the Epworth flowsheet 06/30/2021  Sitting and reading 1  Watching TV 1  Sitting, inactive in a public place (e.g. a theatre or a meeting) 0  As a passenger in a car for an hour without a break 1  Lying down to rest in the afternoon when circumstances permit 2  Sitting and talking to someone 0  Sitting quietly after a lunch without alcohol 0  In a car, while stopped for a few minutes in traffic 0  Total score 5     PHQ 2/9 Scores 06/30/2021  PHQ - 2 Score 4  PHQ- 9 Score 19    GAD 7 : Generalized Anxiety Score 06/30/2021  Nervous, Anxious, on Edge 3  Control/stop worrying 2  Worry too much - different things 3  Trouble relaxing 3  Restless 2  Easily annoyed or irritable 3  Afraid - awful might happen 1  Total GAD 7 Score 17  Anxiety Difficulty Somewhat difficult    BP Readings from Last 3 Encounters:  06/30/21 130/86  06/23/21 112/82  04/19/21 108/79    Physical Exam Vitals and nursing note reviewed.  Constitutional:      General: She is not in acute distress.    Appearance: She is well-developed. She is obese.  HENT:     Head: Normocephalic and atraumatic.  Neck:     Thyroid: Thyromegaly present. No thyroid mass or thyroid tenderness.     Vascular: No carotid bruit.     Trachea: Trachea normal.  Cardiovascular:     Rate and Rhythm: Normal rate and regular rhythm.     Pulses: Normal pulses.     Heart sounds: No murmur heard. Pulmonary:     Effort: Pulmonary effort is normal. No respiratory distress.     Breath sounds: No wheezing or rhonchi.  Musculoskeletal:     Cervical back: Normal range of motion. No tenderness.     Right lower leg: No edema.     Left lower leg: No edema.  Lymphadenopathy:     Cervical:  No cervical adenopathy.  Skin:    General: Skin is warm and dry.     Findings: No rash.  Neurological:     General: No focal deficit present.     Mental Status: She is alert and oriented to person, place, and time.  Psychiatric:        Attention and Perception: Attention normal.        Mood and Affect: Mood normal.        Behavior: Behavior normal.        Thought Content: Thought content does not include suicidal ideation. Thought content does not include suicidal plan.        Cognition and Memory: Cognition normal.        Judgment: Judgment normal.    Wt Readings from Last 3 Encounters:  06/30/21 218 lb 3.2 oz (99 kg)  04/19/21 210 lb 1.6 oz (95.3 kg)  04/17/20 210 lb (95.3 kg)    BP 130/86   Pulse 86   Ht 5\' 2"  (1.575 m)   Wt 218 lb 3.2 oz (99 kg)   SpO2 98%   BMI 39.91 kg/m   Assessment and Plan: 1. Multiple thyroid nodules Last 2020 - no nodules meeting criteria for bx Will repeat 2021 and obtain labs Consider referral to Dr. [place] - Elenore Rota THYROID - TSH+T4F+T3Free - Thyroid peroxidase antibody  2. Sleep disturbances Has snoring, fatigue, non refreshing sleep and night terrors Epworth = 5 Will get home sleep study with [facility]  3. Prediabetes Check labs and advise if medication is needed - Comprehensive metabolic panel - Hemoglobin A1c  4. Restless leg syndrome Intermittent sx; if worsening would consider treatment - CBC with Differential/Platelet  5. Positive screening for depression on 2-item Patient Health Questionnaire (PHQ-2) She has taken multiple different medications in the past with either intolerable side effects or no benefit. She has no SI/HI.  Will continue to monitor at this time as she prefers not to take medication. These include Effexor, Cymbalta, Zoloft among others   Partially dictated using [place]. Any errors are unintentional.  Animal nutritionist, MD [facility]  06/30/2021

## 2021-07-08 ENCOUNTER — Other Ambulatory Visit: Payer: Self-pay

## 2021-07-08 ENCOUNTER — Ambulatory Visit
Admission: RE | Admit: 2021-07-08 | Discharge: 2021-07-08 | Disposition: A | Discharge status: Home / Self Care | Payer: BC Managed Care – PPO | Source: Ambulatory Visit | Attending: Internal Medicine | Admitting: Internal Medicine

## 2021-07-08 DIAGNOSIS — E042 Nontoxic multinodular goiter: Secondary | ICD-10-CM | POA: Diagnosis present

## 2021-07-11 LAB — CBC WITH DIFFERENTIAL/PLATELET
Basophils Absolute: 0 10*3/uL (ref 0.0–0.2)
Basos: 0 %
EOS (ABSOLUTE): 0.1 10*3/uL (ref 0.0–0.4)
Eos: 1 %
Hematocrit: 42 % (ref 34.0–46.6)
Hemoglobin: 14.8 g/dL (ref 11.1–15.9)
Immature Grans (Abs): 0.1 10*3/uL (ref 0.0–0.1)
Immature Granulocytes: 1 %
Lymphocytes Absolute: 3.5 10*3/uL — ABNORMAL HIGH (ref 0.7–3.1)
Lymphs: 39 %
MCH: 31.2 pg (ref 26.6–33.0)
MCHC: 35.2 g/dL (ref 31.5–35.7)
MCV: 89 fL (ref 79–97)
Monocytes Absolute: 0.8 10*3/uL (ref 0.1–0.9)
Monocytes: 9 %
Neutrophils Absolute: 4.6 10*3/uL (ref 1.4–7.0)
Neutrophils: 50 %
Platelets: 279 10*3/uL (ref 150–450)
RBC: 4.74 x10E6/uL (ref 3.77–5.28)
RDW: 12.4 % (ref 11.7–15.4)
WBC: 9.1 10*3/uL (ref 3.4–10.8)

## 2021-07-11 LAB — TSH+T4F+T3FREE
Free T4: 1.49 ng/dL (ref 0.82–1.77)
T3, Free: 3.1 pg/mL (ref 2.0–4.4)
TSH: 0.256 u[IU]/mL — ABNORMAL LOW (ref 0.450–4.500)

## 2021-07-11 LAB — COMPREHENSIVE METABOLIC PANEL
ALT: 125 IU/L — ABNORMAL HIGH (ref 0–32)
AST: 68 IU/L — ABNORMAL HIGH (ref 0–40)
Albumin/Globulin Ratio: 1.5 (ref 1.2–2.2)
Albumin: 4.3 g/dL (ref 3.8–4.8)
Alkaline Phosphatase: 80 IU/L (ref 44–121)
BUN/Creatinine Ratio: 11 (ref 9–23)
BUN: 8 mg/dL (ref 6–20)
Bilirubin Total: 0.9 mg/dL (ref 0.0–1.2)
CO2: 24 mmol/L (ref 20–29)
Calcium: 9.2 mg/dL (ref 8.7–10.2)
Chloride: 96 mmol/L (ref 96–106)
Creatinine, Ser: 0.74 mg/dL (ref 0.57–1.00)
Globulin, Total: 2.8 g/dL (ref 1.5–4.5)
Glucose: 147 mg/dL — ABNORMAL HIGH (ref 70–99)
Potassium: 4 mmol/L (ref 3.5–5.2)
Sodium: 139 mmol/L (ref 134–144)
Total Protein: 7.1 g/dL (ref 6.0–8.5)
eGFR: 107 mL/min/{1.73_m2} (ref >59)

## 2021-07-11 LAB — HEMOGLOBIN A1C
Est. average glucose Bld gHb Est-mCnc: 114 mg/dL
Hgb A1c MFr Bld: 5.6 % (ref 4.8–5.6)

## 2021-07-11 LAB — THYROID PEROXIDASE ANTIBODY: Thyroperoxidase Ab SerPl-aCnc: 20 IU/mL (ref 0–34)

## 2021-07-12 ENCOUNTER — Encounter: Payer: Self-pay | Admitting: Internal Medicine

## 2021-07-13 ENCOUNTER — Other Ambulatory Visit: Payer: Self-pay | Admitting: Internal Medicine

## 2022-01-31 ENCOUNTER — Ambulatory Visit
Admission: EM | Admit: 2022-01-31 | Discharge: 2022-01-31 | Disposition: A | Discharge status: Home / Self Care | Payer: BC Managed Care – PPO | Attending: Physician Assistant | Admitting: Physician Assistant

## 2022-01-31 DIAGNOSIS — J209 Acute bronchitis, unspecified: Secondary | ICD-10-CM

## 2022-01-31 DIAGNOSIS — J45901 Unspecified asthma with (acute) exacerbation: Secondary | ICD-10-CM

## 2022-01-31 DIAGNOSIS — R051 Acute cough: Secondary | ICD-10-CM

## 2022-01-31 MED ORDER — AZITHROMYCIN 250 MG PO TABS: 250.0000 mg | ORAL_TABLET | Freq: Every day | ORAL | 0 refills | Status: DC

## 2022-01-31 MED ORDER — PROAIR RESPICLICK 108 (90 BASE) MCG/ACT IN AEPB: 2.0000 | INHALATION_SPRAY | Freq: Four times a day (QID) | RESPIRATORY_TRACT | 0 refills | Status: AC | PRN

## 2022-01-31 MED ORDER — PROMETHAZINE-DM 6.25-15 MG/5ML PO SYRP: 5.0000 mL | ORAL_SOLUTION | Freq: Four times a day (QID) | ORAL | 0 refills | Status: DC | PRN

## 2022-01-31 NOTE — ED Triage Notes (Signed)
Patient presents to UC for a cold that she has had for about 2 weeks.   She reports she is coughing up "green stuff" and a dry wheezing cough.   Patient states she lost her inhaler that she had.

## 2022-01-31 NOTE — ED Provider Notes (Signed)
[facility]    CSN: [id number] Arrival date & time: 01/31/22  1301      History   Chief Complaint Chief Complaint  Patient presents with   Nasal Congestion   Cough    HPI Jeanne Bates is a 36 y.o. female presenting for 2-week history of nasal congestion cough productive of greenish sputum along with wheezing and shortness of breath.  Denies associated fever.  Denies sinus pain or ear pain.  No sore throat.  No reports of vomiting or diarrhea.  Reports that her family members were sick with similar symptoms before her but they got better and she has not improved.  She also reports she has not worsened.  Has taken multiple OTC medications without improvement in symptoms.  History of asthma.  Patient reports she used to have an albuterol inhaler but no longer has 1 and would like a refill.  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[facility])     Patient Active Problem List   Diagnosis Date Noted   Multiple thyroid nodules 06/30/2021   Sleep disturbances 06/30/2021   Prediabetes 06/30/2021   Restless leg syndrome 06/30/2021   S/P hysterectomy 06/30/2021   Allergic rhinitis due to pollen 06/30/2021   Anxiety disorder 06/30/2021   Irritable bowel syndrome 06/30/2021   Mild intermittent asthma 06/30/2021   Positive screening for depression on 2-item Patient Health Questionnaire (PHQ-2) 06/30/2021    Past Surgical History:  Procedure Laterality Date   ABDOMINAL HYSTERECTOMY     ADENOIDECTOMY     opharectomy     TONSILLECTOMY     WISDOM TOOTH EXTRACTION      OB History   No obstetric history on file.      Home Medications    Prior to Admission medications   Medication Sig Start Date End Date Taking? Authorizing Provider  azithromycin (ZITHROMAX) 250 MG tablet Take 1 tablet (250 mg total) by mouth daily. Take first 2 tablets together,  then 1 every day until finished. 01/31/22  Yes Eusebio Friendly B, PA-C  b complex vitamins capsule Take 1 capsule by mouth daily.   Yes [provider]  Calcium Carbonate Antacid (TUMS PO) Take by mouth.   Yes [provider]  Cholecalciferol 50 MCG (2000 UT) CAPS Take by mouth.   Yes [provider]  famotidine (PEPCID) 20 MG tablet Take 20 mg by mouth 2 (two) times daily. 03/31/20  Yes [provider]  fluticasone (FLONASE) 50 MCG/ACT nasal spray Place 2 sprays into both nostrils daily. 03/22/21  Yes Domenick Gong, MD  loratadine (CLARITIN) 10 MG tablet Take 10 mg by mouth daily.   Yes [provider]  promethazine-dextromethorphan (PROMETHAZINE-DM) 6.25-15 MG/5ML syrup Take 5 mLs by mouth 4 (four) times daily as needed. 01/31/22  Yes Shirlee Latch, PA-C  Spacer/Aero-Holding Chambers (AEROCHAMBER PLUS) inhaler Use with inhaler 03/22/21  Yes Domenick Gong, MD  zinc gluconate 50 MG tablet Take by mouth.   Yes [provider]  Albuterol Sulfate (PROAIR RESPICLICK) 108 (90 Base) MCG/ACT AEPB Inhale 2 puffs into the lungs every 6 (six) hours as needed (wheezing, sob). 01/31/22   Shirlee Latch, PA-C    Family History Family History  Problem Relation Age of Onset   Diabetes Mother    Asthma Mother    Hyperlipidemia Mother    Hyperlipidemia Father  Heart disease Father     Social History Social History   Tobacco Use   Smoking status: Former    Types: Cigarettes    Quit date: 04/18/2007    Years since quitting: 14.8   Smokeless tobacco: Never  Vaping Use   Vaping Use: Never used  Substance Use Topics   Alcohol use: Yes    Comment: occassional   Drug use: Never     Allergies   Black walnut pollen allergy skin test, Cephalexin, Citrus, Liothyronine, Nyquil hbp cold & flu [dm-doxylamine-acetaminophen], Prunus persica, Strawberry (diagnostic), Oxycodone-acetaminophen, Sulfamethoxazole-trimethoprim, Tramadol, Peanut oil, and  Strawberry extract   Review of Systems Review of Systems  Constitutional:  Negative for chills, diaphoresis, fatigue and fever.  HENT:  Positive for congestion and rhinorrhea. Negative for ear pain, sinus pressure, sinus pain and sore throat.   Respiratory:  Positive for cough, shortness of breath and wheezing.   Gastrointestinal:  Negative for abdominal pain, nausea and vomiting.  Musculoskeletal:  Negative for arthralgias and myalgias.  Skin:  Negative for rash.  Neurological:  Negative for weakness and headaches.  Hematological:  Negative for adenopathy.     Physical Exam Triage Vital Signs ED Triage Vitals  Enc Vitals Group     BP 01/31/22 1332 (!) 141/93     Pulse Rate 01/31/22 1332 88     Resp --      Temp 01/31/22 1332 98.9 F (37.2 C)     Temp Source 01/31/22 1332 Oral     SpO2 01/31/22 1332 95 %     Weight 01/31/22 1331 220 lb (99.8 kg)     Height 01/31/22 1331 5\' 2"  (1.575 m)     Head Circumference --      Peak Flow --      Pain Score 01/31/22 1331 0     Pain Loc --      Pain Edu? --      Excl. in GC? --    No data found.  Updated Vital Signs BP (!) 141/93 (BP Location: Left Arm)   Pulse 88   Temp 98.9 F (37.2 C) (Oral)   Ht 5\' 2"  (1.575 m)   Wt 220 lb (99.8 kg)   SpO2 95%   BMI 40.24 kg/m      Physical Exam Vitals and nursing note reviewed.  Constitutional:      General: She is not in acute distress.    Appearance: Normal appearance. She is not ill-appearing or toxic-appearing.  HENT:     Head: Normocephalic and atraumatic.     Nose: Congestion present.     Mouth/Throat:     Mouth: Mucous membranes are moist.     Pharynx: Oropharynx is clear.  Eyes:     General: No scleral icterus.       Right eye: No discharge.        Left eye: No discharge.     Conjunctiva/sclera: Conjunctivae normal.  Cardiovascular:     Rate and Rhythm: Normal rate and regular rhythm.     Heart sounds: Normal heart sounds.  Pulmonary:     Effort: Pulmonary effort  is normal. No respiratory distress.     Breath sounds: Rhonchi (few scattered rhonchi) present.  Musculoskeletal:     Cervical back: Neck supple.  Skin:    General: Skin is dry.  Neurological:     General: No focal deficit present.     Mental Status: She is alert. Mental status is at baseline.     Motor:  No weakness.     Gait: Gait normal.  Psychiatric:        Mood and Affect: Mood normal.        Behavior: Behavior normal.        Thought Content: Thought content normal.      UC Treatments / Results  Labs (all labs ordered are listed, but only abnormal results are displayed) Labs Reviewed - No data to display  EKG   Radiology No results found.  Procedures Procedures (including critical care time)  Medications Ordered in UC Medications - No data to display  Initial Impression / Assessment and Plan / UC Course  I have reviewed the triage vital signs and the nursing notes.  Pertinent labs & imaging results that were available during my care of the patient were reviewed by me and considered in my medical decision making (see chart for details).  36 year old female presenting for cough and congestion along with wheezing and shortness of breath over the past 2 weeks.  Symptoms not better or worsening.  Patient is afebrile and overall well-appearing.  On exam she has nasal congestion without drainage as well as few scattered rhonchi.  No respiratory distress.  Clinical presentation consistent with bronchitis, likely viral but given the fact that her symptoms have been ongoing for 2 weeks and her sputum has been green the entire time, will try antibiotic trial.  Sent azithromycin.  Also refilled her albuterol inhaler for asthma.  Likely having a mild asthma exacerbation.  Offered prednisone but she declines that she does not have good reaction to the medicine.  Sent Promethazine DM for cough.  Reviewed increase rest and fluids.  Reviewed return precautions.   Final Clinical  Impressions(s) / UC Diagnoses   Final diagnoses:  Acute bronchitis, unspecified organism  Acute cough  Asthma with acute exacerbation, unspecified asthma severity, unspecified whether persistent     Discharge Instructions      -You have bronchitis and flareup of asthma.  Likely continued viral symptoms but given the fact that its been 2 weeks may have gotten any better we can try an antibiotic at this time.  I have also refilled your albuterol inhaler and sent a cough medicine. - If you develop a fever or worsening symptoms you should come back in so we can reevaluate you and obtain an x-ray.     ED Prescriptions     Medication Sig Dispense Auth. Provider   Albuterol Sulfate (PROAIR RESPICLICK) 108 (90 Base) MCG/ACT AEPB Inhale 2 puffs into the lungs every 6 (six) hours as needed (wheezing, sob). 1 each Shirlee Latch, PA-C   azithromycin (ZITHROMAX) 250 MG tablet Take 1 tablet (250 mg total) by mouth daily. Take first 2 tablets together, then 1 every day until finished. 6 tablet Shirlee Latch, PA-C   promethazine-dextromethorphan (PROMETHAZINE-DM) 6.25-15 MG/5ML syrup Take 5 mLs by mouth 4 (four) times daily as needed. 118 mL Shirlee Latch, PA-C      PDMP not reviewed this encounter.   Shirlee Latch, PA-C 01/31/22 1438

## 2022-01-31 NOTE — Discharge Instructions (Addendum)
-  You have bronchitis and flareup of asthma.  Likely continued viral symptoms but given the fact that its been 2 weeks may have gotten any better we can try an antibiotic at this time.  I have also refilled your albuterol inhaler and sent a cough medicine. - If you develop a fever or worsening symptoms you should come back in so we can reevaluate you and obtain an x-ray.

## 2022-09-30 DIAGNOSIS — E559 Vitamin D deficiency, unspecified: Secondary | ICD-10-CM | POA: Insufficient documentation

## 2022-10-04 DIAGNOSIS — E063 Autoimmune thyroiditis: Secondary | ICD-10-CM | POA: Insufficient documentation

## 2022-10-04 DIAGNOSIS — R030 Elevated blood-pressure reading, without diagnosis of hypertension: Secondary | ICD-10-CM | POA: Insufficient documentation

## 2022-10-05 DIAGNOSIS — E785 Hyperlipidemia, unspecified: Secondary | ICD-10-CM | POA: Insufficient documentation

## 2023-01-19 IMAGING — US US THYROID
1 series · 14 of 25 positions shown · non-contrast
Comparison: 06/17/2019

CLINICAL DATA: 35-year-old female with a history of thyroid nodule

EXAM:
THYROID ULTRASOUND
TECHNIQUE: Ultrasound examination of the thyroid gland and adjacent soft
tissues was performed.

[Series 1: us thyroid · 0.07mm/px · 14 of 48 slices shown]
[im 1/48]
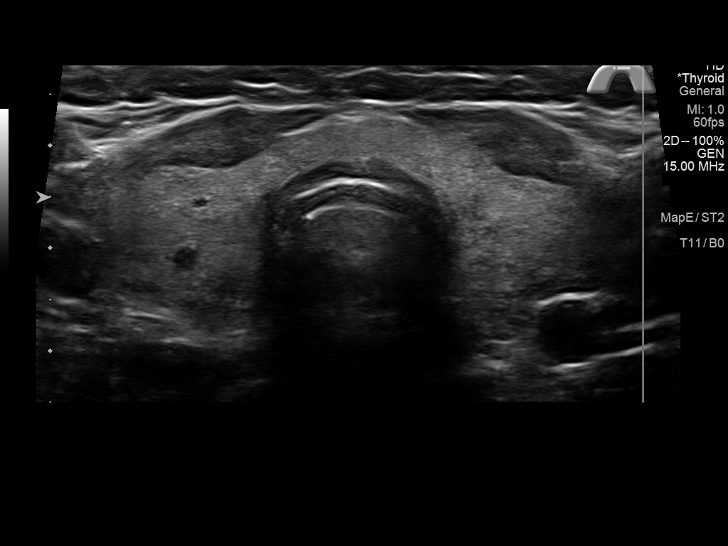
[im 4/48]
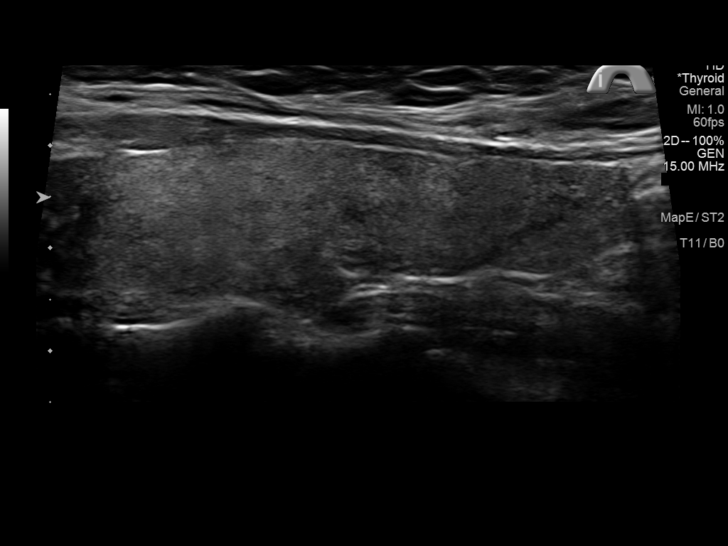
[im 8/48]
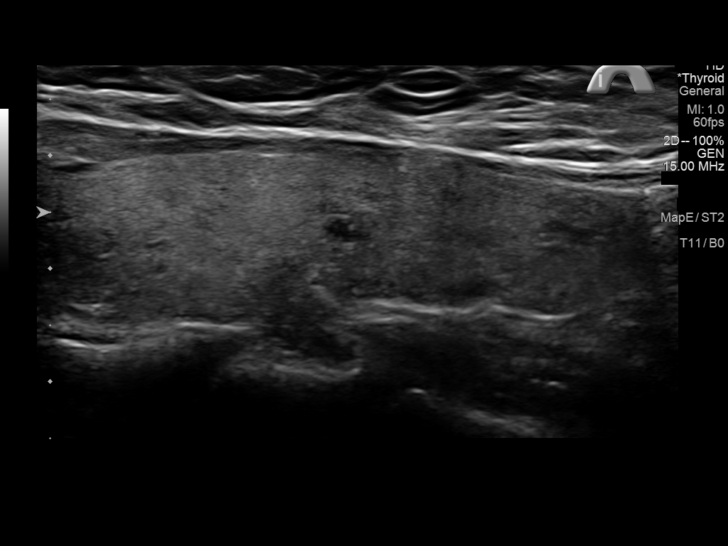
[im 12/48]
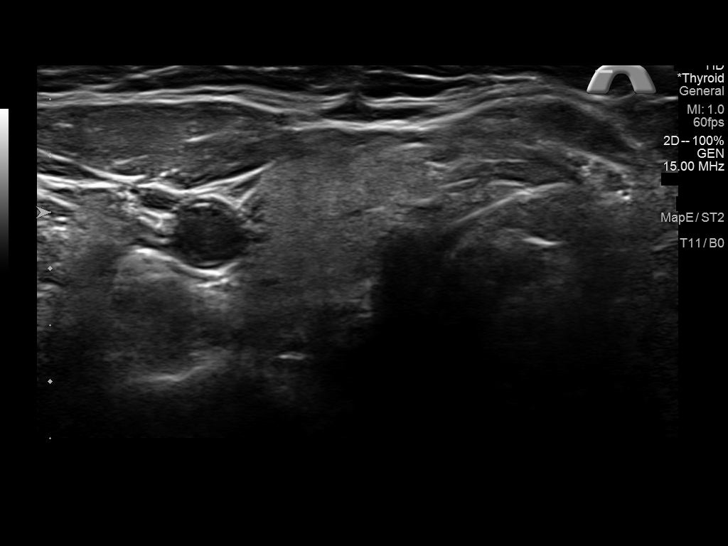
[im 16/48]
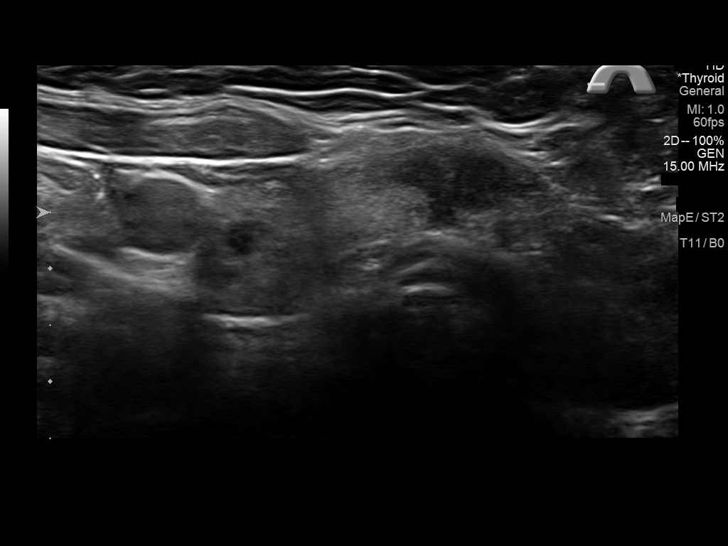
[im 18/48]
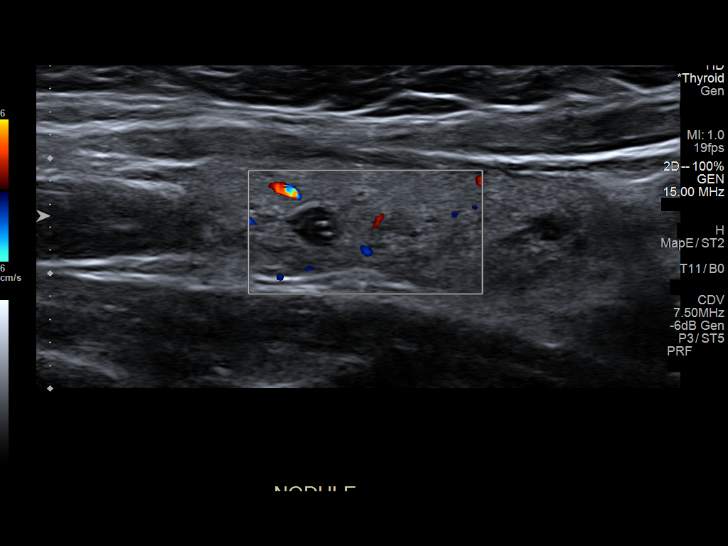
[im 22/48]
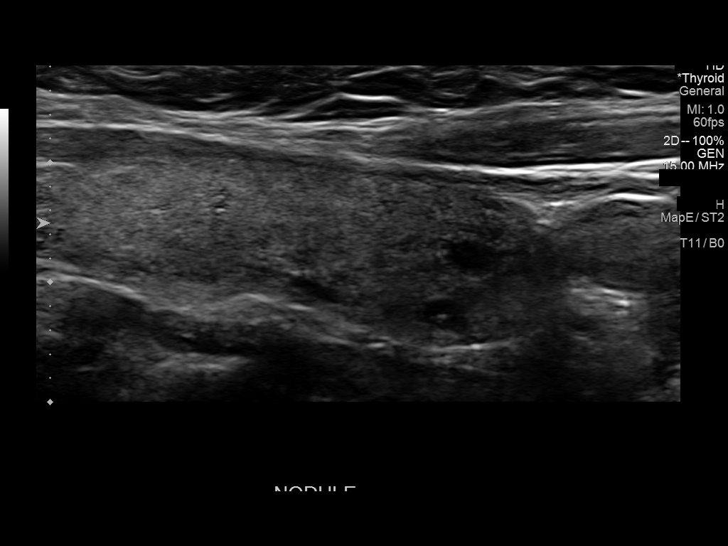
[im 26/48]
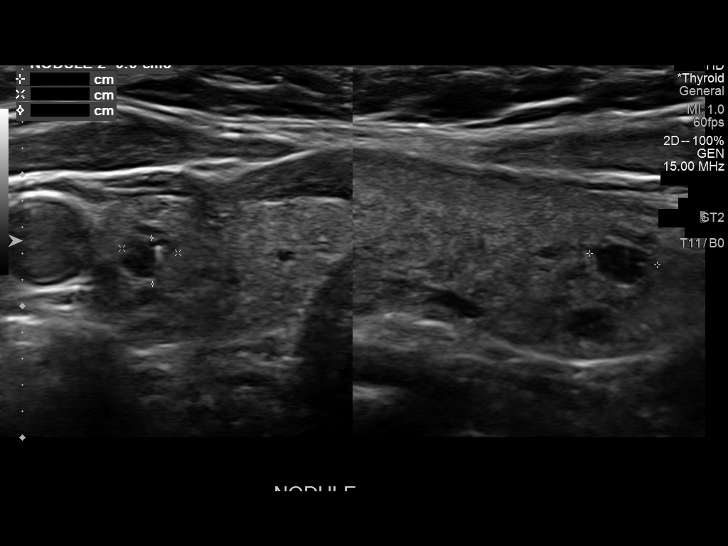
[im 30/48]
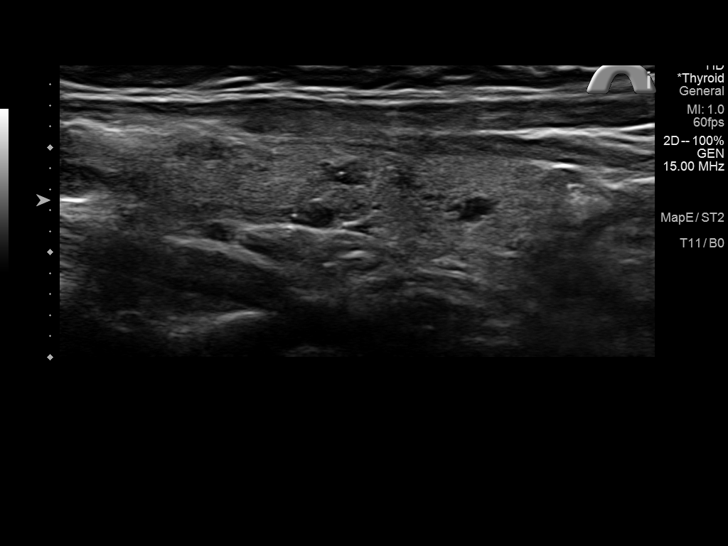
[im 32/48]
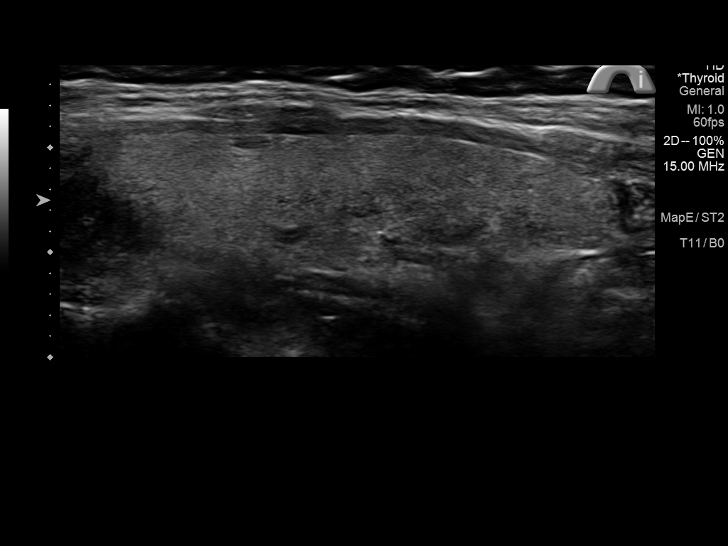
[im 36/48]
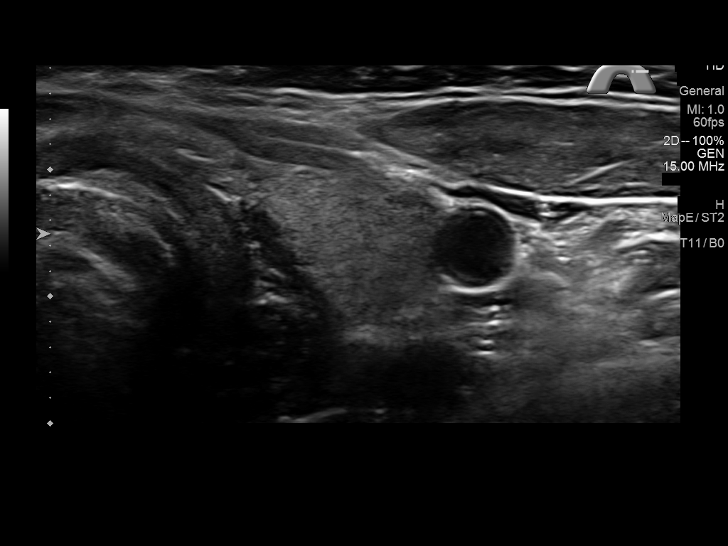
[im 40/48]
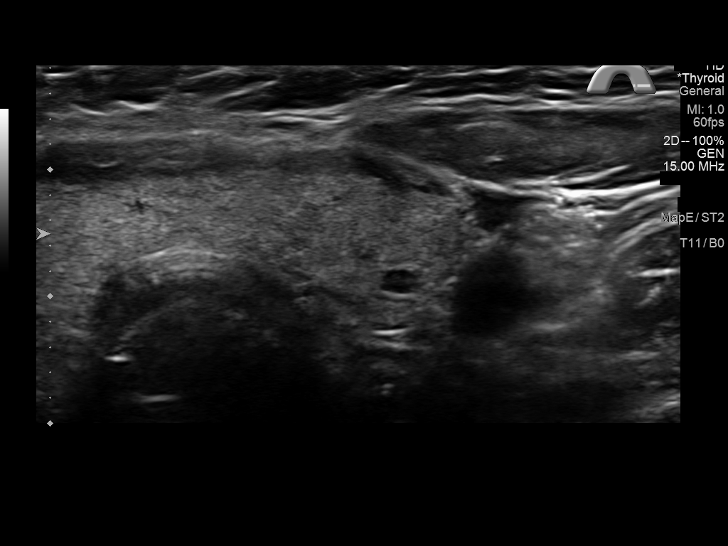
[im 44/48]
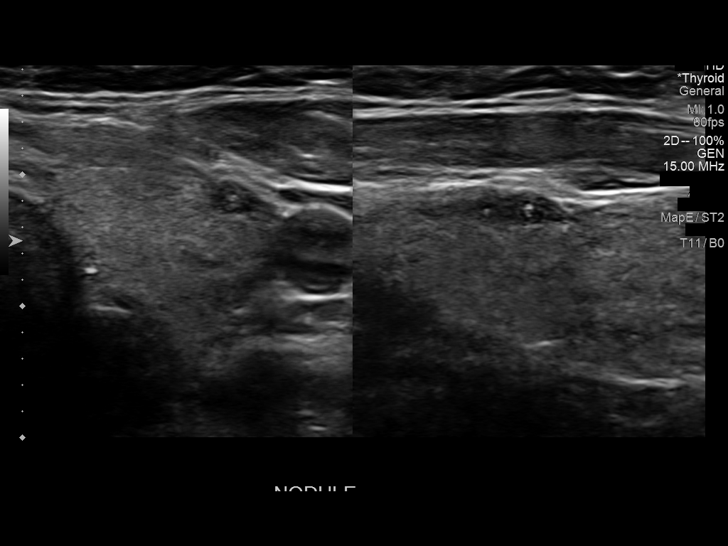
[im 48/48]
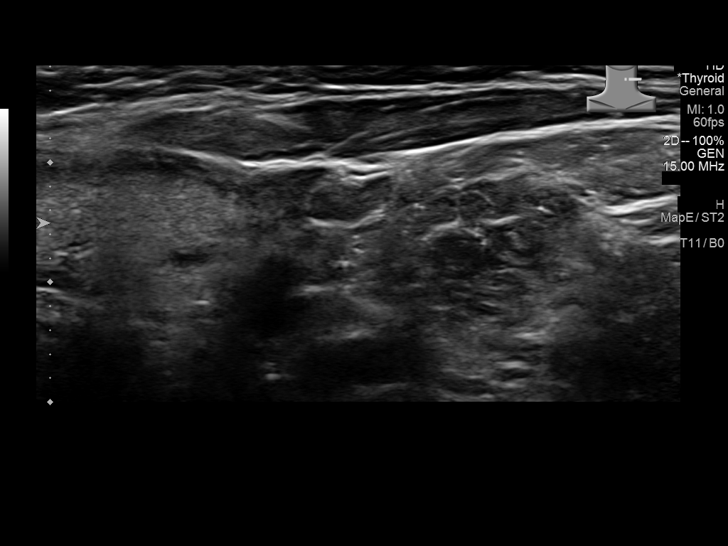

[14 of 25 positions shown; findings below may reference images not displayed]

FINDINGS: Parenchymal Echotexture: Moderately heterogenous

Isthmus: 0.5 cm

Right lobe: 5.3 cm x 1.7 cm x 2.0 cm

Left lobe: 5.3 cm x 1.5 cm x 1.8 cm

_________________________________________________________

Estimated total number of nodules >/= 1 cm: 0

Number of spongiform nodules >/=  2 cm not described below (TR1): 0

Number of mixed cystic and solid nodules >/= 1.5 cm not described
below (TR2): 0

_________________________________________________________

There are several bilateral thyroid nodules/cysts/colloid cysts,
none of which measures greater than 7 mm, and none of which meet
criteria for surveillance.

No adenopathy

Recommendations follow those established by the new ACR TI-RADS
criteria ([HOSPITAL] 3582;[DATE]).
IMPRESSION: Heterogeneous thyroid may indicate medical thyroid disease.

## 2024-01-08 ENCOUNTER — Other Ambulatory Visit: Payer: Self-pay | Admitting: Obstetrics and Gynecology

## 2024-01-08 DIAGNOSIS — Z1231 Encounter for screening mammogram for malignant neoplasm of breast: Secondary | ICD-10-CM

## 2024-06-10 ENCOUNTER — Ambulatory Visit: Admission: EM | Admit: 2024-06-10 | Discharge: 2024-06-10 | Disposition: A | Discharge status: Home / Self Care

## 2024-06-10 DIAGNOSIS — K121 Other forms of stomatitis: Secondary | ICD-10-CM

## 2024-06-10 MED ORDER — LIDOCAINE VISCOUS HCL 2 % MT SOLN: 15.0000 mL | Freq: Four times a day (QID) | OROMUCOSAL | 0 refills | Status: AC | PRN

## 2024-06-10 NOTE — Discharge Instructions (Addendum)
 Gargle with the viscous lidocaine  4 times a day as needed for sore throat pain.  You may also use over-the-counter Chloraseptic or Sucrets lozenges.  No more than 1 lozenge every 2 hours as the menthol may give you diarrhea.  Use over-the-counter Tylenol and/or ibuprofen  as needed for pain.  Avoid any hard or crunchy foods to avoid any further trauma to the throat which could worsen the pain you are experiencing.  I would recommend following a soft diet.  Drinking cool or warm fluids frequently to help keep your throat moist can also help with the pain.  If you develop any new or worsening symptoms other return for reevaluation, see your PCP, or follow-up with your dentist.

## 2024-06-10 NOTE — ED Provider Notes (Signed)
 [facility]    CSN: [id number] Arrival date & time: 06/10/24  1154      History   Chief Complaint Chief Complaint  Patient presents with   Otalgia    HPI Jeanne Bates is a 38 y.o. female.   HPI  38 year old female with past medical history significant for IBS, Hashimoto's, asthma, mast cell activation presents for evaluation of 5 days worth of right ear pain and fullness.  She reports that symptoms initially began with headache and a generalized sore throat she has noticed a large ulcer in the back of her throat on the right-hand side.  Past Medical History:  Diagnosis Date   Asthma    Hashimoto thyroiditis, fibrous variant    IBS (irritable bowel syndrome)    Mast cell activation syndrome     Patient Active Problem List   Diagnosis Date Noted   Hyperlipidemia 10/05/2022   Elevated blood pressure reading in office without diagnosis of hypertension 10/04/2022   Hashimoto's disease 10/04/2022   Vitamin D deficiency 09/30/2022   Multiple thyroid nodules 06/30/2021   Sleep disturbances 06/30/2021   Prediabetes 06/30/2021   Restless leg syndrome 06/30/2021   S/P hysterectomy 06/30/2021   Allergic rhinitis due to pollen 06/30/2021   Anxiety disorder 06/30/2021   Mild intermittent asthma without complication 06/30/2021   Positive screening for depression on 2-item Patient Health Questionnaire (PHQ-2) 06/30/2021   Irritable bowel syndrome with diarrhea 12/11/2019   Dysmenorrhea 12/11/2019   Obesity with body mass index 30 or greater 12/11/2019   Pelvic congestion syndrome 12/11/2019    Past Surgical History:  Procedure Laterality Date   ABDOMINAL HYSTERECTOMY     ADENOIDECTOMY     opharectomy     TONSILLECTOMY     WISDOM TOOTH EXTRACTION      OB History   No obstetric history on file.      Home Medications    Prior to Admission medications   Medication Sig Start Date End Date Taking? Authorizing Provider  buPROPion (WELLBUTRIN XL) 150 MG  24 hr tablet Take 150 mg by mouth daily. 06/03/24  Yes [provider]  lidocaine  (XYLOCAINE ) 2 % solution Use as directed 15 mLs in the mouth or throat every 6 (six) hours as needed for mouth pain. 06/10/24  Yes Bernardino Ditch, NP  LYLLANA 0.05 MG/24HR patch SMARTSIG:1 Patch(s) Topical Twice a Week 06/01/24  Yes [provider]  Albuterol  Sulfate (PROAIR  RESPICLICK) 108 (90 Base) MCG/ACT AEPB Inhale 2 puffs into the lungs every 6 (six) hours as needed (wheezing, sob). 01/31/22   Arvis Huxley B, PA-C  b complex vitamins capsule Take 1 capsule by mouth daily.    [provider]  Calcium Carbonate Antacid (TUMS PO) Take by mouth.    [provider]  Cholecalciferol 50 MCG (2000 UT) CAPS Take by mouth.    [provider]  famotidine (PEPCID) 20 MG tablet Take 20 mg by mouth 2 (two) times daily. 03/31/20   [provider]  fluticasone  (FLONASE ) 50 MCG/ACT nasal spray Place 2 sprays into both nostrils daily. 03/22/21   Mortenson, Ashley, MD  loratadine (CLARITIN) 10 MG tablet Take 10 mg by mouth daily.    [provider]  Spacer/Aero-Holding Chambers (AEROCHAMBER PLUS) inhaler Use with inhaler 03/22/21   Van Knee, MD  zinc gluconate 50 MG tablet Take by mouth.    [provider]    Family History Family History  Problem Relation Age of Onset   Diabetes Mother  Asthma Mother    Hyperlipidemia Mother    Hyperlipidemia Father    Heart disease Father     Social History Social History   Tobacco Use   Smoking status: Former    Current packs/day: 0.00    Types: Cigarettes    Quit date: 04/18/2007    Years since quitting: 17.1   Smokeless tobacco: Never  Vaping Use   Vaping status: Never Used  Substance Use Topics   Alcohol use: Yes    Comment: occassional   Drug use: Never     Allergies   Black walnut pollen allergy skin test, Cephalexin, Citrus, Liothyronine, Nyquil hbp cold & flu [dm-doxylamine-acetaminophen],  Prunus persica, Strawberry (diagnostic), Oxycodone-acetaminophen, Sulfamethoxazole-trimethoprim, Tramadol, Peanut oil, and Strawberry extract   Review of Systems Review of Systems  Constitutional:  Negative for fever.  HENT:  Positive for ear pain and sore throat. Negative for congestion, ear discharge and hearing loss.      Physical Exam Triage Vital Signs ED Triage Vitals  Encounter Vitals Group     BP      Girls Systolic BP Percentile      Girls Diastolic BP Percentile      Boys Systolic BP Percentile      Boys Diastolic BP Percentile      Pulse      Resp      Temp      Temp src      SpO2      Weight      Height      Head Circumference      Peak Flow      Pain Score      Pain Loc      Pain Education      Exclude from Growth Chart    No data found.  Updated Vital Signs BP (!) 138/90 (BP Location: Right Arm)   Pulse 90   Temp 98.6 F (37 C) (Oral)   Resp 14   Wt 230 lb (104.3 kg)   SpO2 96%   BMI 42.07 kg/m   Visual Acuity Right Eye Distance:   Left Eye Distance:   Bilateral Distance:    Right Eye Near:   Left Eye Near:    Bilateral Near:     Physical Exam Vitals and nursing note reviewed.  Constitutional:      Appearance: Normal appearance. She is not ill-appearing.  HENT:     Head: Normocephalic and atraumatic.     Right Ear: Tympanic membrane, ear canal and external ear normal. There is no impacted cerumen.     Left Ear: Tympanic membrane, ear canal and external ear normal. There is no impacted cerumen.     Mouth/Throat:     Mouth: Mucous membranes are moist.     Pharynx: Oropharynx is clear. Posterior oropharyngeal erythema present. No oropharyngeal exudate.     Comments: Has a approximately 1 cm long linear ulcer/abrasion to the posterior oropharynx on the right. Cardiovascular:     Rate and Rhythm: Normal rate and regular rhythm.     Pulses: Normal pulses.     Heart sounds: Normal heart sounds. No murmur heard.    No friction rub. No  gallop.  Pulmonary:     Effort: Pulmonary effort is normal.     Breath sounds: Normal breath sounds. No wheezing, rhonchi or rales.  Musculoskeletal:     Cervical back: Normal range of motion and neck supple. No tenderness.  Lymphadenopathy:     Cervical: No cervical adenopathy.  Skin:    General: Skin is warm and dry.     Capillary Refill: Capillary refill takes less than 2 seconds.     Findings: No rash.  Neurological:     General: No focal deficit present.     Mental Status: She is alert and oriented to person, place, and time.      UC Treatments / Results  Labs (all labs ordered are listed, but only abnormal results are displayed) Labs Reviewed - No data to display  EKG   Radiology No results found.  Procedures Procedures (including critical care time)  Medications Ordered in UC Medications - No data to display  Initial Impression / Assessment and Plan / UC Course  I have reviewed the triage vital signs and the nursing notes.  Pertinent labs & imaging results that were available during my care of the patient were reviewed by me and considered in my medical decision making (see chart for details).   Patient is a pleasant, nontoxic-appearing 38 year old female presenting for evaluation of right ear pain as outlined in HPI above.  On exam patient has pearly gray tympanic membranes bilaterally with normal light reflex.  Both EACs are clear.  Oropharyngeal exam does reveal a linear abrasion/ulcer on the posterior pharynx on the right.  There is mild erythema to the surrounding tissue but no significant edema and no exudate noted.  No cervical lymphadenopathy present.  I suspect that the patient's pain in her ear is secondary to the ulcer in her throat.  She denies any history of oral ulcers and she denies any recent trauma.  She does not remember eating anything hard or crunchy which may have abraded the back of her throat.  She also does not remember hitting the back of her  throat when she brushed her teeth.  I will discharge her home with viscous lidocaine  to help with the sore throat pain and have also encouraged her to use over-the-counter Tylenol and/or ibuprofen .  She may also use over-the-counter Chloraseptic or Sucrets lozenges.  Return precautions reviewed.  Work note provided.     Final Clinical Impressions(s) / UC Diagnoses   Final diagnoses:  Oral ulceration     Discharge Instructions      Gargle with the viscous lidocaine  4 times a day as needed for sore throat pain.  You may also use over-the-counter Chloraseptic or Sucrets lozenges.  No more than 1 lozenge every 2 hours as the menthol may give you diarrhea.  Use over-the-counter Tylenol and/or ibuprofen  as needed for pain.  Avoid any hard or crunchy foods to avoid any further trauma to the throat which could worsen the pain you are experiencing.  I would recommend following a soft diet.  Drinking cool or warm fluids frequently to help keep your throat moist can also help with the pain.  If you develop any new or worsening symptoms other return for reevaluation, see your PCP, or follow-up with your dentist.     ED Prescriptions     Medication Sig Dispense Auth. Provider   lidocaine  (XYLOCAINE ) 2 % solution Use as directed 15 mLs in the mouth or throat every 6 (six) hours as needed for mouth pain. 100 mL Bernardino Ditch, NP      PDMP not reviewed this encounter.   Bernardino Ditch, NP 06/10/24 1226

## 2024-06-10 NOTE — ED Triage Notes (Signed)
 Pt c/o R ear pain & fullness x5 days. States noticed an ulcer behind throat on Thursday. Has tried OTC meds w/o relief.
